# Patient Record
Sex: Female | Born: 1990 | Hispanic: No | Marital: Married | Smoking: Never smoker
Health system: Southern US, Community
[De-identification: ages and names within clinical notes are randomized; demographics above are authoritative.]

## PROBLEM LIST (undated history)

## (undated) ENCOUNTER — Inpatient Hospital Stay (HOSPITAL_COMMUNITY): Payer: Self-pay

## (undated) DIAGNOSIS — Z789 Other specified health status: Secondary | ICD-10-CM

## (undated) HISTORY — PX: NO PAST SURGERIES: SHX2092

---

## 2013-04-14 NOTE — L&D Delivery Note (Signed)
Delivery Note Pt became complete and pushed well and at 11:36 PM a viable female was delivered via Vaginal, Spontaneous Delivery (Presentation: ; Right Occiput Anterior).  APGAR: 9, 9; weight 8 lb 11.2 oz (3945 g).  Infant dried; cord clamped and cut by FOB and infant to warmer per pt request.  Placenta status: Intact, Spontaneous.  Cord: 3 vessels  Anesthesia: Epidural  Episiotomy: None Lacerations: 2nd degree;Perineal Suture Repair: 3.0 vicryl Est. Blood Loss (mL): 500  Mom to postpartum.  Baby to Couplet care / Skin to Skin.  Cam HaiSHAW, KIMBERLY CNM 03/02/2014, 7:11 AM   `````Attestation of Attending Supervision of Advanced Practitioner: Evaluation and management procedures were performed by the PA/NP/CNM/OB Fellow under my supervision/collaboration. Chart reviewed and agree with management and plan.  Marjie Chea V 03/02/2014 8:40 AM

## 2013-07-05 ENCOUNTER — Ambulatory Visit (INDEPENDENT_AMBULATORY_CARE_PROVIDER_SITE_OTHER): Payer: Self-pay

## 2013-07-05 DIAGNOSIS — Z3201 Encounter for pregnancy test, result positive: Secondary | ICD-10-CM

## 2013-07-05 DIAGNOSIS — Z3400 Encounter for supervision of normal first pregnancy, unspecified trimester: Secondary | ICD-10-CM

## 2013-07-05 DIAGNOSIS — Z3689 Encounter for other specified antenatal screening: Secondary | ICD-10-CM

## 2013-07-05 LAB — POCT PREGNANCY, URINE: PREG TEST UR: POSITIVE — AB

## 2013-07-05 NOTE — Progress Notes (Signed)
Pt here for pregnancy test, resulted positive.  Pt stated that she would like to come here for prenatal care.  Pt was sure of LMP. First pregnancy, G1 P0.  Anatomy US scheduled for 10/04/13 @ 0800. Initial labs drawn today.

## 2013-07-06 LAB — OBSTETRIC PANEL
Antibody Screen: NEGATIVE
BASOS ABS: 0 10*3/uL (ref 0.0–0.1)
BASOS PCT: 0 % (ref 0–1)
Eosinophils Absolute: 0.1 10*3/uL (ref 0.0–0.7)
Eosinophils Relative: 1 % (ref 0–5)
HCT: 36.8 % (ref 36.0–46.0)
HEMOGLOBIN: 12 g/dL (ref 12.0–15.0)
HEP B S AG: NEGATIVE
Lymphocytes Relative: 26 % (ref 12–46)
Lymphs Abs: 1.5 10*3/uL (ref 0.7–4.0)
MCH: 25.1 pg — ABNORMAL LOW (ref 26.0–34.0)
MCHC: 32.6 g/dL (ref 30.0–36.0)
MCV: 76.8 fL — ABNORMAL LOW (ref 78.0–100.0)
Monocytes Absolute: 0.5 10*3/uL (ref 0.1–1.0)
Monocytes Relative: 8 % (ref 3–12)
NEUTROS ABS: 3.8 10*3/uL (ref 1.7–7.7)
NEUTROS PCT: 65 % (ref 43–77)
Platelets: 238 10*3/uL (ref 150–400)
RBC: 4.79 MIL/uL (ref 3.87–5.11)
RDW: 13.8 % (ref 11.5–15.5)
Rh Type: POSITIVE
Rubella: 7.45 Index — ABNORMAL HIGH (ref <0.90)
WBC: 5.8 10*3/uL (ref 4.0–10.5)

## 2013-07-06 LAB — PRESCRIPTION MONITORING PROFILE (19 PANEL)
Amphetamine/Meth: NEGATIVE ng/mL
BARBITURATE SCREEN, URINE: NEGATIVE ng/mL
Benzodiazepine Screen, Urine: NEGATIVE ng/mL
Buprenorphine, Urine: NEGATIVE ng/mL
CANNABINOID SCRN UR: NEGATIVE ng/mL
COCAINE METABOLITES: NEGATIVE ng/mL
Carisoprodol, Urine: NEGATIVE ng/mL
Creatinine, Urine: 224.82 mg/dL (ref >20.0)
FENTANYL URINE: NEGATIVE ng/mL
MDMA URINE: NEGATIVE ng/mL
Meperidine, Ur: NEGATIVE ng/mL
Methadone Screen, Urine: NEGATIVE ng/mL
Methaqualone: NEGATIVE ng/mL
Nitrites, Initial: NEGATIVE ug/mL
OPIATE SCREEN, URINE: NEGATIVE ng/mL
Oxycodone Screen, Ur: NEGATIVE ng/mL
PH URINE, INITIAL: 6.8 pH (ref 4.5–8.9)
Phencyclidine, Ur: NEGATIVE ng/mL
Propoxyphene: NEGATIVE ng/mL
TAPENTADOLUR: NEGATIVE ng/mL
Tramadol Scrn, Ur: NEGATIVE ng/mL
Zolpidem, Urine: NEGATIVE ng/mL

## 2013-07-06 LAB — HIV ANTIBODY (ROUTINE TESTING W REFLEX): HIV: NONREACTIVE

## 2013-07-07 LAB — HEMOGLOBINOPATHY EVALUATION
HGB A2 QUANT: 2.5 % (ref 2.2–3.2)
Hemoglobin Other: 0 %
Hgb A: 97.1 % (ref 96.8–97.8)
Hgb F Quant: 0.4 % (ref 0.0–2.0)
Hgb S Quant: 0 %

## 2013-07-07 LAB — CULTURE, OB URINE

## 2013-08-09 ENCOUNTER — Encounter: Payer: Self-pay | Admitting: Advanced Practice Midwife

## 2013-08-09 ENCOUNTER — Ambulatory Visit (INDEPENDENT_AMBULATORY_CARE_PROVIDER_SITE_OTHER): Payer: Self-pay | Admitting: Advanced Practice Midwife

## 2013-08-09 VITALS — BP 116/75 | HR 91 | Ht 63.0 in | Wt 103.5 lb

## 2013-08-09 DIAGNOSIS — Z609 Problem related to social environment, unspecified: Secondary | ICD-10-CM

## 2013-08-09 DIAGNOSIS — K219 Gastro-esophageal reflux disease without esophagitis: Secondary | ICD-10-CM | POA: Insufficient documentation

## 2013-08-09 DIAGNOSIS — Z603 Acculturation difficulty: Secondary | ICD-10-CM | POA: Insufficient documentation

## 2013-08-09 DIAGNOSIS — Z3400 Encounter for supervision of normal first pregnancy, unspecified trimester: Secondary | ICD-10-CM

## 2013-08-09 LAB — POCT URINALYSIS DIP (DEVICE)
Bilirubin Urine: NEGATIVE
GLUCOSE, UA: NEGATIVE mg/dL
HGB URINE DIPSTICK: NEGATIVE
KETONES UR: NEGATIVE mg/dL
Nitrite: NEGATIVE
Protein, ur: NEGATIVE mg/dL
Specific Gravity, Urine: >=1.03 (ref 1.005–1.030)
UROBILINOGEN UA: 0.2 mg/dL (ref 0.0–1.0)
pH: 5.5 (ref 5.0–8.0)

## 2013-08-09 MED ORDER — PRENATAL VITAMINS 0.8 MG PO TABS: 1.0000 | ORAL_TABLET | Freq: Every day | ORAL | Status: DC

## 2013-08-09 MED ORDER — PANTOPRAZOLE SODIUM 20 MG PO TBEC: 20.0000 mg | DELAYED_RELEASE_TABLET | Freq: Every day | ORAL | Status: DC

## 2013-08-09 NOTE — Progress Notes (Signed)
New OB.  Subjective:    Rebekah Morse is a G1P0 8523w3d being seen today for her first obstetrical visit.  Her obstetrical history is significant for slightly late to care, language barrier (Arabic). Patient does intend to breast feed. Pregnancy history fully reviewed.  Patient reports nausea and acid reflux.  Filed Vitals:   08/09/13 1449 08/09/13 1452  BP: 116/75   Pulse: 91   Height:  5\' 3"  (1.6 m)  Weight: 46.947 kg (103 lb 8 oz)     HISTORY: OB History  Gravida Para Term Preterm AB SAB TAB Ectopic Multiple Living  1             # Outcome Date GA Lbr Len/2nd Weight Sex Delivery Anes PTL Lv  1 CUR              History reviewed. No pertinent past medical history. Past Surgical History  Procedure Laterality Date  . No past surgeries     History reviewed. No pertinent family history.   Exam    Uterus:     Pelvic Exam:    Perineum: No Hemorrhoids, Normal Perineum   Vulva: Bartholin's, Urethra, Skene's normal   Vagina:  normal mucosa, normal discharge   pH:    Cervix: no cervical motion tenderness, no lesions and nulliparous appearance   Adnexa: normal adnexa and no mass, fullness, tenderness   Bony Pelvis: gynecoid  System: Breast:  normal appearance, no masses or tenderness   Skin: normal coloration and turgor, no rashes    Neurologic: oriented, normal mood, grossly non-focal   Extremities: normal strength, tone, and muscle mass   HEENT neck supple with midline trachea   Mouth/Teeth mucous membranes moist, pharynx normal without lesions   Neck supple and no masses   Cardiovascular: regular rate and rhythm, no murmurs or gallops   Respiratory:  appears well, vitals normal, no respiratory distress, acyanotic, normal RR, ear and throat exam is normal, neck free of mass or lymphadenopathy, chest clear, no wheezing, crepitations, rhonchi, normal symmetric air entry   Abdomen: soft, non-tender; bowel sounds normal; no masses,  no organomegaly   Urinary: urethral meatus  normal      Assessment:    Pregnancy: G1P0 There are no active problems to display for this patient.       Plan:     Initial labs drawn. Prenatal vitamins. Problem list reviewed and updated. Genetic Screening discussed Quad Screen: requested.  Ultrasound discussed; fetal survey: ordered. For June.  Follow up in 4 weeks. 50% of 30 min visit spent on counseling and coordination of care.     Aviva SignsMarie L Zahari Xiang 08/09/2013

## 2013-08-09 NOTE — Progress Notes (Signed)
C/o of lower abdominal pressure and hip pain.  C/o of discharge with slight odor; denies itching.  Pap with cultures today. Wet prep.

## 2013-08-09 NOTE — Patient Instructions (Signed)
Second Trimester of Pregnancy The second trimester is from week 13 through week 28, months 4 through 6. The second trimester is often a time when you feel your best. Your body has also adjusted to being pregnant, and you begin to feel better physically. Usually, morning sickness has lessened or quit completely, you may have more energy, and you may have an increase in appetite. The second trimester is also a time when the fetus is growing rapidly. At the end of the sixth month, the fetus is about 9 inches long and weighs about 1 pounds. You will likely begin to feel the baby move (quickening) between 18 and 20 weeks of the pregnancy. BODY CHANGES Your body goes through many changes during pregnancy. The changes vary from woman to woman.   Your weight will continue to increase. You will notice your lower abdomen bulging out.  You may begin to get stretch marks on your hips, abdomen, and breasts.  You may develop headaches that can be relieved by medicines approved by your caregiver.  You may urinate more often because the fetus is pressing on your bladder.  You may develop or continue to have heartburn as a result of your pregnancy.  You may develop constipation because certain hormones are causing the muscles that push waste through your intestines to slow down.  You may develop hemorrhoids or swollen, bulging veins (varicose veins).  You may have back pain because of the weight gain and pregnancy hormones relaxing your joints between the bones in your pelvis and as a result of a shift in weight and the muscles that support your balance.  Your breasts will continue to grow and be tender.  Your gums may bleed and may be sensitive to brushing and flossing.  Dark spots or blotches (chloasma, mask of pregnancy) may develop on your face. This will likely fade after the baby is born.  A dark line from your belly button to the pubic area (linea nigra) may appear. This will likely fade after the  baby is born. WHAT TO EXPECT AT YOUR PRENATAL VISITS During a routine prenatal visit:  You will be weighed to make sure you and the fetus are growing normally.  Your blood pressure will be taken.  Your abdomen will be measured to track your baby's growth.  The fetal heartbeat will be listened to.  Any test results from the previous visit will be discussed. Your caregiver may ask you:  How you are feeling.  If you are feeling the baby move.  If you have had any abnormal symptoms, such as leaking fluid, bleeding, severe headaches, or abdominal cramping.  If you have any questions. Other tests that may be performed during your second trimester include:  Blood tests that check for:  Low iron levels (anemia).  Gestational diabetes (between 24 and 28 weeks).  Rh antibodies.  Urine tests to check for infections, diabetes, or protein in the urine.  An ultrasound to confirm the proper growth and development of the baby.  An amniocentesis to check for possible genetic problems.  Fetal screens for spina bifida and Down syndrome. HOME CARE INSTRUCTIONS   Avoid all smoking, herbs, alcohol, and unprescribed drugs. These chemicals affect the formation and growth of the baby.  Follow your caregiver's instructions regarding medicine use. There are medicines that are either safe or unsafe to take during pregnancy.  Exercise only as directed by your caregiver. Experiencing uterine cramps is a good sign to stop exercising.  Continue to eat regular,   healthy meals.  Wear a good support bra for breast tenderness.  Do not use hot tubs, steam rooms, or saunas.  Wear your seat belt at all times when driving.  Avoid raw meat, uncooked cheese, cat litter boxes, and soil used by cats. These carry germs that can cause birth defects in the baby.  Take your prenatal vitamins.  Try taking a stool softener (if your caregiver approves) if you develop constipation. Eat more high-fiber foods,  such as fresh vegetables or fruit and whole grains. Drink plenty of fluids to keep your urine clear or pale yellow.  Take warm sitz baths to soothe any pain or discomfort caused by hemorrhoids. Use hemorrhoid cream if your caregiver approves.  If you develop varicose veins, wear support hose. Elevate your feet for 15 minutes, 3 4 times a day. Limit salt in your diet.  Avoid heavy lifting, wear low heel shoes, and practice good posture.  Rest with your legs elevated if you have leg cramps or low back pain.  Visit your dentist if you have not gone yet during your pregnancy. Use a soft toothbrush to brush your teeth and be gentle when you floss.  A sexual relationship may be continued unless your caregiver directs you otherwise.  Continue to go to all your prenatal visits as directed by your caregiver. SEEK MEDICAL CARE IF:   You have dizziness.  You have mild pelvic cramps, pelvic pressure, or nagging pain in the abdominal area.  You have persistent nausea, vomiting, or diarrhea.  You have a bad smelling vaginal discharge.  You have pain with urination. SEEK IMMEDIATE MEDICAL CARE IF:   You have a fever.  You are leaking fluid from your vagina.  You have spotting or bleeding from your vagina.  You have severe abdominal cramping or pain.  You have rapid weight gain or loss.  You have shortness of breath with chest pain.  You notice sudden or extreme swelling of your face, hands, ankles, feet, or legs.  You have not felt your baby move in over an hour.  You have severe headaches that do not go away with medicine.  You have vision changes. Document Released: 03/25/2001 Document Revised: 12/01/2012 Document Reviewed: 06/01/2012 [employer] Patient Information 2014 [employer].  

## 2013-08-10 LAB — WET PREP, GENITAL
Clue Cells Wet Prep HPF POC: NONE SEEN
Trich, Wet Prep: NONE SEEN
WBC, Wet Prep HPF POC: NONE SEEN

## 2013-08-16 ENCOUNTER — Telehealth: Payer: Self-pay | Admitting: *Deleted

## 2013-08-16 ENCOUNTER — Encounter: Payer: Self-pay | Admitting: *Deleted

## 2013-08-16 DIAGNOSIS — B3731 Acute candidiasis of vulva and vagina: Secondary | ICD-10-CM

## 2013-08-16 DIAGNOSIS — B373 Candidiasis of vulva and vagina: Secondary | ICD-10-CM

## 2013-08-16 MED ORDER — FLUCONAZOLE 150 MG PO TABS: 150.0000 mg | ORAL_TABLET | Freq: Once | ORAL | Status: DC

## 2013-08-16 NOTE — Telephone Encounter (Signed)
Patient has yeast on her pap and wet prep. Called her to let her know but phone doesn't ring, just picks up and then disconnects. Will send rx to pharmacy and send letter to patient.

## 2013-09-06 ENCOUNTER — Ambulatory Visit (INDEPENDENT_AMBULATORY_CARE_PROVIDER_SITE_OTHER): Payer: PPO | Admitting: Family Medicine

## 2013-09-06 VITALS — BP 108/71 | HR 85 | Temp 98.5°F | Wt 107.9 lb

## 2013-09-06 DIAGNOSIS — Z609 Problem related to social environment, unspecified: Secondary | ICD-10-CM

## 2013-09-06 DIAGNOSIS — Z603 Acculturation difficulty: Secondary | ICD-10-CM

## 2013-09-06 DIAGNOSIS — Z3400 Encounter for supervision of normal first pregnancy, unspecified trimester: Secondary | ICD-10-CM

## 2013-09-06 NOTE — Progress Notes (Signed)
S: 23 yo G1 @ [redacted]w[redacted]d here for ROBV - doing well.  - no concerns. No bleeding.  - declines quad screen today   O: see flowsheet  A/P - doing well - declines quad screen - anatomy US scheduled.  - f/u in 4 weeks

## 2013-09-06 NOTE — Patient Instructions (Signed)
Second Trimester of Pregnancy The second trimester is from week 13 through week 28, months 4 through 6. The second trimester is often a time when you feel your best. Your body has also adjusted to being pregnant, and you begin to feel better physically. Usually, morning sickness has lessened or quit completely, you may have more energy, and you may have an increase in appetite. The second trimester is also a time when the fetus is growing rapidly. At the end of the sixth month, the fetus is about 9 inches long and weighs about 1 pounds. You will likely begin to feel the baby move (quickening) between 18 and 20 weeks of the pregnancy. BODY CHANGES Your body goes through many changes during pregnancy. The changes vary from woman to woman.   Your weight will continue to increase. You will notice your lower abdomen bulging out.  You may begin to get stretch marks on your hips, abdomen, and breasts.  You may develop headaches that can be relieved by medicines approved by your caregiver.  You may urinate more often because the fetus is pressing on your bladder.  You may develop or continue to have heartburn as a result of your pregnancy.  You may develop constipation because certain hormones are causing the muscles that push waste through your intestines to slow down.  You may develop hemorrhoids or swollen, bulging veins (varicose veins).  You may have back pain because of the weight gain and pregnancy hormones relaxing your joints between the bones in your pelvis and as a result of a shift in weight and the muscles that support your balance.  Your breasts will continue to grow and be tender.  Your gums may bleed and may be sensitive to brushing and flossing.  Dark spots or blotches (chloasma, mask of pregnancy) may develop on your face. This will likely fade after the baby is born.  A dark line from your belly button to the pubic area (linea nigra) may appear. This will likely fade after the  baby is born. WHAT TO EXPECT AT YOUR PRENATAL VISITS During a routine prenatal visit:  You will be weighed to make sure you and the fetus are growing normally.  Your blood pressure will be taken.  Your abdomen will be measured to track your baby's growth.  The fetal heartbeat will be listened to.  Any test results from the previous visit will be discussed. Your caregiver may ask you:  How you are feeling.  If you are feeling the baby move.  If you have had any abnormal symptoms, such as leaking fluid, bleeding, severe headaches, or abdominal cramping.  If you have any questions. Other tests that may be performed during your second trimester include:  Blood tests that check for:  Low iron levels (anemia).  Gestational diabetes (between 24 and 28 weeks).  Rh antibodies.  Urine tests to check for infections, diabetes, or protein in the urine.  An ultrasound to confirm the proper growth and development of the baby.  An amniocentesis to check for possible genetic problems.  Fetal screens for spina bifida and Down syndrome. HOME CARE INSTRUCTIONS   Avoid all smoking, herbs, alcohol, and unprescribed drugs. These chemicals affect the formation and growth of the baby.  Follow your caregiver's instructions regarding medicine use. There are medicines that are either safe or unsafe to take during pregnancy.  Exercise only as directed by your caregiver. Experiencing uterine cramps is a good sign to stop exercising.  Continue to eat regular,   healthy meals.  Wear a good support bra for breast tenderness.  Do not use hot tubs, steam rooms, or saunas.  Wear your seat belt at all times when driving.  Avoid raw meat, uncooked cheese, cat litter boxes, and soil used by cats. These carry germs that can cause birth defects in the baby.  Take your prenatal vitamins.  Try taking a stool softener (if your caregiver approves) if you develop constipation. Eat more high-fiber foods,  such as fresh vegetables or fruit and whole grains. Drink plenty of fluids to keep your urine clear or pale yellow.  Take warm sitz baths to soothe any pain or discomfort caused by hemorrhoids. Use hemorrhoid cream if your caregiver approves.  If you develop varicose veins, wear support hose. Elevate your feet for 15 minutes, 3 4 times a day. Limit salt in your diet.  Avoid heavy lifting, wear low heel shoes, and practice good posture.  Rest with your legs elevated if you have leg cramps or low back pain.  Visit your dentist if you have not gone yet during your pregnancy. Use a soft toothbrush to brush your teeth and be gentle when you floss.  A sexual relationship may be continued unless your caregiver directs you otherwise.  Continue to go to all your prenatal visits as directed by your caregiver. SEEK MEDICAL CARE IF:   You have dizziness.  You have mild pelvic cramps, pelvic pressure, or nagging pain in the abdominal area.  You have persistent nausea, vomiting, or diarrhea.  You have a bad smelling vaginal discharge.  You have pain with urination. SEEK IMMEDIATE MEDICAL CARE IF:   You have a fever.  You are leaking fluid from your vagina.  You have spotting or bleeding from your vagina.  You have severe abdominal cramping or pain.  You have rapid weight gain or loss.  You have shortness of breath with chest pain.  You notice sudden or extreme swelling of your face, hands, ankles, feet, or legs.  You have not felt your baby move in over an hour.  You have severe headaches that do not go away with medicine.  You have vision changes. Document Released: 03/25/2001 Document Revised: 12/01/2012 Document Reviewed: 06/01/2012 [employer] Patient Information 2014 [employer].  

## 2013-09-07 LAB — POCT URINALYSIS DIP (DEVICE)
BILIRUBIN URINE: NEGATIVE
GLUCOSE, UA: NEGATIVE mg/dL
HGB URINE DIPSTICK: NEGATIVE
Ketones, ur: NEGATIVE mg/dL
Leukocytes, UA: NEGATIVE
Nitrite: NEGATIVE
Protein, ur: NEGATIVE mg/dL
SPECIFIC GRAVITY, URINE: 1.015 (ref 1.005–1.030)
Urobilinogen, UA: 0.2 mg/dL (ref 0.0–1.0)
pH: 6 (ref 5.0–8.0)

## 2013-09-19 ENCOUNTER — Ambulatory Visit (HOSPITAL_COMMUNITY)
Admission: RE | Admit: 2013-09-19 | Discharge: 2013-09-19 | Disposition: A | Discharge status: Home / Self Care | Payer: PPO | Source: Ambulatory Visit | Attending: Obstetrics & Gynecology | Admitting: Obstetrics & Gynecology

## 2013-09-19 DIAGNOSIS — Z3400 Encounter for supervision of normal first pregnancy, unspecified trimester: Secondary | ICD-10-CM

## 2013-09-19 DIAGNOSIS — Z3689 Encounter for other specified antenatal screening: Secondary | ICD-10-CM | POA: Diagnosis not present

## 2013-09-26 ENCOUNTER — Encounter: Payer: Self-pay | Admitting: Obstetrics & Gynecology

## 2013-09-27 ENCOUNTER — Telehealth: Payer: Self-pay | Admitting: General Practice

## 2013-09-27 ENCOUNTER — Encounter: Payer: Self-pay | Admitting: General Practice

## 2013-09-27 DIAGNOSIS — Z3400 Encounter for supervision of normal first pregnancy, unspecified trimester: Secondary | ICD-10-CM

## 2013-09-27 NOTE — Telephone Encounter (Signed)
Ultrasound appt made for 7/7 @ 10:45. Called patient with pacific interpreter [phone] on home number, no answer & unable to leave a message. Called patient at mobile number, no answer- left message that we calling to get in touch with you about an appt, please give us a call back at the clinics.

## 2013-09-27 NOTE — Telephone Encounter (Signed)
Message copied by Kathee DeltonHILLMAN, CARRIE L on Tue Sep 27, 2013  8:01 AM ------      Message from: Willodean RosenthalHARRAWAY-SMITH, CAROLYN      Created: Mon Sep 26, 2013  5:59 PM       Please call pt.  She needs to be schedule for complete anatomy scan in the next 2-3 weeks.            thx      clh-S ------

## 2013-09-28 NOTE — Telephone Encounter (Signed)
Called patient with pacific interpreter [phone], no answer on both numbers and unable to leave message.

## 2013-09-29 NOTE — Telephone Encounter (Signed)
Note made to inform patient of ultrasound at her visit next week.

## 2013-10-04 ENCOUNTER — Ambulatory Visit (INDEPENDENT_AMBULATORY_CARE_PROVIDER_SITE_OTHER): Payer: PPO | Admitting: Advanced Practice Midwife

## 2013-10-04 VITALS — BP 108/64 | HR 91 | Temp 98.3°F | Wt 111.6 lb

## 2013-10-04 DIAGNOSIS — Z3400 Encounter for supervision of normal first pregnancy, unspecified trimester: Secondary | ICD-10-CM

## 2013-10-04 DIAGNOSIS — Z3402 Encounter for supervision of normal first pregnancy, second trimester: Secondary | ICD-10-CM

## 2013-10-04 LAB — POCT URINALYSIS DIP (DEVICE)
BILIRUBIN URINE: NEGATIVE
GLUCOSE, UA: NEGATIVE mg/dL
Hgb urine dipstick: NEGATIVE
Ketones, ur: NEGATIVE mg/dL
LEUKOCYTES UA: NEGATIVE
Nitrite: NEGATIVE
Protein, ur: NEGATIVE mg/dL
Specific Gravity, Urine: 1.025 (ref 1.005–1.030)
Urobilinogen, UA: 0.2 mg/dL (ref 0.0–1.0)
pH: 6.5 (ref 5.0–8.0)

## 2013-10-04 NOTE — Progress Notes (Signed)
Doing well.  Feeling fetal flutters, denies vaginal bleeding, LOF, regular contractions.  Some pelvic girdle pain/pain in hips with movement.  Reviewed comfortable positions for sleep, normal physiologic changes of pregnancy.

## 2013-10-04 NOTE — Progress Notes (Signed)
Reports bilateral hip pain and intermittent lower abdominal/pelvic pain.  Informed patient of U/S on 7/7

## 2013-10-07 ENCOUNTER — Ambulatory Visit (HOSPITAL_COMMUNITY): Payer: Self-pay

## 2013-10-10 ENCOUNTER — Encounter: Payer: Self-pay | Admitting: General Practice

## 2013-10-18 ENCOUNTER — Ambulatory Visit (HOSPITAL_COMMUNITY)
Admission: RE | Admit: 2013-10-18 | Discharge: 2013-10-18 | Disposition: A | Discharge status: Home / Self Care | Payer: PPO | Source: Ambulatory Visit | Attending: Obstetrics & Gynecology | Admitting: Obstetrics & Gynecology

## 2013-10-18 DIAGNOSIS — Z3689 Encounter for other specified antenatal screening: Secondary | ICD-10-CM | POA: Insufficient documentation

## 2013-10-18 DIAGNOSIS — Z3400 Encounter for supervision of normal first pregnancy, unspecified trimester: Secondary | ICD-10-CM

## 2013-10-18 DIAGNOSIS — Z3402 Encounter for supervision of normal first pregnancy, second trimester: Secondary | ICD-10-CM

## 2013-10-27 ENCOUNTER — Encounter: Payer: Self-pay | Admitting: Obstetrics & Gynecology

## 2013-10-29 ENCOUNTER — Emergency Department (HOSPITAL_COMMUNITY)
Admission: EM | Admit: 2013-10-29 | Discharge: 2013-10-29 | Disposition: A | Discharge status: Home / Self Care | Payer: PPO | Source: Home / Self Care | Attending: Emergency Medicine | Admitting: Emergency Medicine

## 2013-10-29 ENCOUNTER — Encounter (HOSPITAL_COMMUNITY): Payer: Self-pay | Admitting: Emergency Medicine

## 2013-10-29 ENCOUNTER — Inpatient Hospital Stay (HOSPITAL_COMMUNITY)
Admission: AD | Admit: 2013-10-29 | Discharge: 2013-10-29 | Disposition: A | Discharge status: Home / Self Care | Payer: PPO | Source: Ambulatory Visit | Attending: Obstetrics & Gynecology | Admitting: Obstetrics & Gynecology

## 2013-10-29 ENCOUNTER — Encounter (HOSPITAL_COMMUNITY): Payer: Self-pay

## 2013-10-29 DIAGNOSIS — O4702 False labor before 37 completed weeks of gestation, second trimester: Secondary | ICD-10-CM

## 2013-10-29 DIAGNOSIS — O212 Late vomiting of pregnancy: Secondary | ICD-10-CM | POA: Insufficient documentation

## 2013-10-29 DIAGNOSIS — O9989 Other specified diseases and conditions complicating pregnancy, childbirth and the puerperium: Principal | ICD-10-CM

## 2013-10-29 DIAGNOSIS — Z79899 Other long term (current) drug therapy: Secondary | ICD-10-CM

## 2013-10-29 DIAGNOSIS — R141 Gas pain: Secondary | ICD-10-CM | POA: Insufficient documentation

## 2013-10-29 DIAGNOSIS — R142 Eructation: Secondary | ICD-10-CM

## 2013-10-29 DIAGNOSIS — O99891 Other specified diseases and conditions complicating pregnancy: Secondary | ICD-10-CM | POA: Insufficient documentation

## 2013-10-29 DIAGNOSIS — O4700 False labor before 37 completed weeks of gestation, unspecified trimester: Secondary | ICD-10-CM

## 2013-10-29 DIAGNOSIS — O26899 Other specified pregnancy related conditions, unspecified trimester: Secondary | ICD-10-CM

## 2013-10-29 DIAGNOSIS — R143 Flatulence: Secondary | ICD-10-CM | POA: Insufficient documentation

## 2013-10-29 DIAGNOSIS — R1110 Vomiting, unspecified: Secondary | ICD-10-CM

## 2013-10-29 DIAGNOSIS — R109 Unspecified abdominal pain: Secondary | ICD-10-CM | POA: Diagnosis present

## 2013-10-29 LAB — URINALYSIS, ROUTINE W REFLEX MICROSCOPIC
Bilirubin Urine: NEGATIVE
Glucose, UA: NEGATIVE mg/dL
Hgb urine dipstick: NEGATIVE
Ketones, ur: NEGATIVE mg/dL
LEUKOCYTES UA: NEGATIVE
NITRITE: NEGATIVE
Protein, ur: NEGATIVE mg/dL
UROBILINOGEN UA: 0.2 mg/dL (ref 0.0–1.0)
pH: 6 (ref 5.0–8.0)

## 2013-10-29 MED ORDER — PROMETHAZINE HCL 25 MG PO TABS: 25.0000 mg | ORAL_TABLET | Freq: Four times a day (QID) | ORAL | Status: AC | PRN

## 2013-10-29 NOTE — MAU Provider Note (Signed)
Attestation of Attending Supervision of Advanced Practitioner (CNM/NP): Evaluation and management procedures were performed by the Advanced Practitioner under my supervision and collaboration.  I have reviewed the Advanced Practitioner's note and chart, and I agree with the management and plan.  HARRAWAY-SMITH, Aleatha Taite 11:16 AM

## 2013-10-29 NOTE — MAU Provider Note (Signed)
History     CSN: [id number]  Arrival date and time: 10/29/13 [id number]   First Provider Initiated Contact with Patient 10/29/13 [phone]      Chief Complaint  Patient presents with  . Abdominal Cramping   HPI Comments: Rebekah Morse 23 y.o. G1P0000 6968w4d presents to MAU with abdominal cramping that started this morning. She had some nausea and vomited at 5 am. She thought her nausea had passed so [facility] became [facility] She was transferred her for same issue. She was check there an found to have closed cervix. +FHT. [facility] and has an appointment on Wednesday.  Abdominal Cramping Associated symptoms include nausea and vomiting.      History reviewed. No pertinent past medical history.  Past Surgical History  Procedure Laterality Date  . No past surgeries      History reviewed. No pertinent family history.  History  Substance Use Topics  . Smoking status: Never Smoker   . Smokeless tobacco: Never Used  . Alcohol Use: No    Allergies: No Known Allergies  Prescriptions prior to admission  Medication Sig Dispense Refill  . Prenatal Vit-Fe Fumarate-FA (PRENATAL MULTIVITAMIN) TABS tablet Take 1 tablet by mouth daily at 12 noon.        Review of Systems  Constitutional: Negative.   HENT: Negative.   Eyes: Negative.   Respiratory: Negative.   Cardiovascular: Negative.   Gastrointestinal: Positive for heartburn, nausea, vomiting and abdominal pain.  Genitourinary: Negative.   Musculoskeletal: Negative.   Skin: Negative.   Neurological: Negative.   Psychiatric/Behavioral: Negative.    Physical Exam   Blood pressure 101/53, pulse 69, temperature 98 F (36.7 C), temperature source Oral, resp. rate 16, last menstrual period 04/30/2013.  Physical Exam  Constitutional: She is oriented to person, place, and time. She appears well-developed and well-nourished. No distress.  HENT:  Head: Normocephalic and atraumatic.  GI: Soft. She  exhibits no distension. There is no tenderness. There is no rebound and no guarding.  Genitourinary:  Genital:External Vaginal:Small amount white discharge Cervix:closed/ thick Bimanual:nontender/ gravid   Musculoskeletal: Normal range of motion.  Neurological: She is alert and oriented to person, place, and time.  Skin: Skin is warm and dry.  Psychiatric: She has a normal mood and affect. Her behavior is normal. Judgment and thought content normal.   Results for orders placed during the hospital encounter of 10/29/13 (from the past 24 hour(s))  URINALYSIS, ROUTINE W REFLEX MICROSCOPIC     Status: Abnormal   Collection Time    10/29/13  8:20 AM      Result Value Ref Range   Color, Urine YELLOW  YELLOW   APPearance CLEAR  CLEAR   Specific Gravity, Urine <1.005 (*) 1.005 - 1.030   pH 6.0  5.0 - 8.0   Glucose, UA NEGATIVE  NEGATIVE mg/dL   Hgb urine dipstick NEGATIVE  NEGATIVE   Bilirubin Urine NEGATIVE  NEGATIVE   Ketones, ur NEGATIVE  NEGATIVE mg/dL   Protein, ur NEGATIVE  NEGATIVE mg/dL   Urobilinogen, UA 0.2  0.0 - 1.0 mg/dL   Nitrite NEGATIVE  NEGATIVE   Leukocytes, UA NEGATIVE  NEGATIVE    MAU Course  Procedures  MDM   Assessment and Plan  A: Abdominal pains in pregnancy likely related to nausea  P: Phenergan 25 mg po q8 hrs prn  Fluids/ rest/ tylenol Keep OB appointment Wednesday Return to MAU as needed  Carolynn ServeBarefoot, Sanaiyah Kirchhoff Miller 10/29/2013, 9:49 AM

## 2013-10-29 NOTE — ED Notes (Signed)
Husband/translator stated,she started having abd.pain about a hour ago. No bleeding just abd. Pain.  Pt. Is 6 months pregnant

## 2013-10-29 NOTE — Progress Notes (Signed)
Dr. Freida BusmanAllen spoke with Dr. Emelda FearFerguson. Pt and her husband [facility] for further monitoring.

## 2013-10-29 NOTE — ED Notes (Signed)
Ob rapid response rn here

## 2013-10-29 NOTE — ED Provider Notes (Addendum)
CSN: [id number]     Arrival date & time 10/29/13  [id number] History   First MD Initiated Contact with Patient 10/29/13 0703     Chief Complaint  Patient presents with  . Abdominal Pain  . Emesis  . Routine Prenatal Visit     (Consider location/radiation/quality/duration/timing/severity/associated sxs/prior Treatment) Patient is a 23 y.o. female presenting with abdominal pain and vomiting. The history is provided by the patient.  Abdominal Pain Associated symptoms: vomiting   Emesis Associated symptoms: abdominal pain    patient here complaining of mild abdominal cramping since this morning. Patient is 6 months pregnant and has had uncomplicated pregnancy. She denies any urinary symptoms. No vaginal bleeding or discharge. The abdominal cramping and pain only lasts for a few minutes. Symptoms are spontaneous and nothing makes it better or worse. No treatment used prior to arrival.  History reviewed. No pertinent past medical history. Past Surgical History  Procedure Laterality Date  . No past surgeries     No family history on file. History  Substance Use Topics  . Smoking status: Never Smoker   . Smokeless tobacco: Never Used  . Alcohol Use: No   OB History   Grav Para Term Preterm Abortions TAB SAB Ect Mult Living   1 0 0 0 0 0 0 0 0 0      Review of Systems  Gastrointestinal: Positive for vomiting and abdominal pain.  All other systems reviewed and are negative.     Allergies  Review of patient's allergies indicates no known allergies.  Home Medications   Prior to Admission medications   Medication Sig Start Date End Date Taking? Authorizing Provider  Prenatal Vit-Fe Fumarate-FA (PRENATAL MULTIVITAMIN) TABS tablet Take 1 tablet by mouth daily at 12 noon.   Yes Historical Provider, MD   BP 98/60  Pulse 66  Temp(Src) 98.2 F (36.8 C) (Oral)  Resp 18  Wt 116 lb 13.5 oz (53 kg)  SpO2 98%  LMP 04/30/2013 Physical Exam  Nursing note and vitals  reviewed. Constitutional: She is oriented to person, place, and time. She appears well-developed and well-nourished.  Non-toxic appearance. No distress.  HENT:  Head: Normocephalic and atraumatic.  Eyes: Conjunctivae, EOM and lids are normal. Pupils are equal, round, and reactive to light.  Neck: Normal range of motion. Neck supple. No tracheal deviation present. No mass present.  Cardiovascular: Normal rate, regular rhythm and normal heart sounds.  Exam reveals no gallop.   No murmur heard. Pulmonary/Chest: Effort normal and breath sounds normal. No stridor. No respiratory distress. She has no decreased breath sounds. She has no wheezes. She has no rhonchi. She has no rales.  Abdominal: Soft. She exhibits distension. There is no tenderness. There is no rebound and no CVA tenderness.  gravid  Musculoskeletal: Normal range of motion. She exhibits no edema and no tenderness.  Neurological: She is alert and oriented to person, place, and time. She has normal strength. No cranial nerve deficit or sensory deficit. GCS eye subscore is 4. GCS verbal subscore is 5. GCS motor subscore is 6.  Skin: Skin is warm and dry. No abrasion and no rash noted.  Psychiatric: She has a normal mood and affect. Her speech is normal and behavior is normal.    ED Course  Procedures (including critical care time) Labs Review Labs Reviewed - No data to display  Imaging Review No results found.   EKG Interpretation None      MDM   Final diagnoses:  None  Has been seen by the rapid response nurse who did that patient's GYN exam because of the patient's religious believes. Patient has no evidence of active labor at this time. Fetal heart tones 150.Per the OB nurse, patient's cervix closed there is no evidence of vaginal bleeding. I went back to examine the patient's abdomen prior to discharge and the patient has eloped.Case discussed with the OB on call, dr Emelda Fear, prior to the patient's leaving and it was  agreed that the patient is stable to be transported by private vehicle at this time. The patient and her husband are agreeable to this.  The patient did not receive her discharge instructions because they left before they were given to them  Toy Baker, MD 10/29/13 0725  Toy Baker, MD 10/29/13 0730  Toy Baker, MD 10/29/13 [id number]  Toy Baker, MD 10/29/13 [phone]

## 2013-10-29 NOTE — ED Notes (Signed)
Pt hooked to the fetal monitor by the ob rn.  No vaginal bleeding..  The pt reports that her abd pain is decreased.

## 2013-10-29 NOTE — MAU Note (Signed)
[facility]. Cervix was closed and long.

## 2013-10-29 NOTE — Progress Notes (Signed)
Dr. Freida BusmanAllen in to see pt. Pt had been c/o some cramping upon arrival to ED. No vag bleeding, no leaking of fluid. SVE done by Alcide GoodnessKathy Shaniqua Guillot RN earlier, cervix closed. Uterine irritability on EFM. FHR monitored previously by Alcide GoodnessKathy Shaivi Rothschild RN, FHR 150 BPM. [facility].

## 2013-10-29 NOTE — Discharge Instructions (Signed)
[facility]

## 2013-10-29 NOTE — Discharge Instructions (Signed)
Abdominal Pain During Pregnancy Abdominal pain is common in pregnancy. Most of the time, it does not cause harm. There are many causes of abdominal pain. Some causes are more serious than others. Some of the causes of abdominal pain in pregnancy are easily diagnosed. Occasionally, the diagnosis takes time to understand. Other times, the cause is not determined. Abdominal pain can be a sign that something is very wrong with the pregnancy, or the pain may have nothing to do with the pregnancy at all. For this reason, always tell your health care provider if you have any abdominal discomfort. HOME CARE INSTRUCTIONS  Monitor your abdominal pain for any changes. The following actions may help to alleviate any discomfort you are experiencing:  Do not have sexual intercourse or put anything in your vagina until your symptoms go away completely.  Get plenty of rest until your pain improves.  Drink clear fluids if you feel nauseous. Avoid solid food as long as you are uncomfortable or nauseous.  Only take over-the-counter or prescription medicine as directed by your health care provider.  Keep all follow-up appointments with your health care provider. SEEK IMMEDIATE MEDICAL CARE IF:  You are bleeding, leaking fluid, or passing tissue from the vagina.  You have increasing pain or cramping.  You have persistent vomiting.  You have painful or bloody urination.  You have a fever.  You notice a decrease in your baby's movements.  You have extreme weakness or feel faint.  You have shortness of breath, with or without abdominal pain.  You develop a severe headache with abdominal pain.  You have abnormal vaginal discharge with abdominal pain.  You have persistent diarrhea.  You have abdominal pain that continues even after rest, or gets worse. MAKE SURE YOU:   Understand these instructions.  Will watch your condition.  Will get help right away if you are not doing well or get  worse. Document Released: 03/31/2005 Document Revised: 01/19/2013 Document Reviewed: 10/28/2012 [facility] Patient Information 2015 [place], [place]. This information is not intended to replace advice given to you by your health care provider. Make sure you discuss any questions you have with your health care provider. Morning Sickness Morning sickness is when you feel sick to your stomach (nauseous) during pregnancy. This nauseous feeling may or may not come with vomiting. It often occurs in the morning but can be a problem any time of day. Morning sickness is most common during the first trimester, but it may continue throughout pregnancy. While morning sickness is unpleasant, it is usually harmless unless you develop severe and continual vomiting (hyperemesis gravidarum). This condition requires more intense treatment.  CAUSES  The cause of morning sickness is not completely known but seems to be related to normal hormonal changes that occur in pregnancy. RISK FACTORS You are at greater risk if you:  Experienced nausea or vomiting before your pregnancy.  Had morning sickness during a previous pregnancy.  Are pregnant with more than one baby, such as twins. TREATMENT  Do not use any medicines (prescription, over-the-counter, or herbal) for morning sickness without first talking to your health care provider. Your health care provider may prescribe or recommend:  Vitamin B6 supplements.  Anti-nausea medicines.  The herbal medicine ginger. HOME CARE INSTRUCTIONS   Only take over-the-counter or prescription medicines as directed by your health care provider.  Taking multivitamins before getting pregnant can prevent or decrease the severity of morning sickness in most women.  Eat a piece of dry toast or unsalted crackers  before getting out of bed in the morning.  Eat five or six small meals a day.  Eat dry and bland foods (rice, baked potato). Foods high in carbohydrates are often  helpful.  Do not drink liquids with your meals. Drink liquids between meals.  Avoid greasy, fatty, and spicy foods.  Get someone to cook for you if the smell of any food causes nausea and vomiting.  If you feel nauseous after taking prenatal vitamins, take the vitamins at night or with a snack.  Snack on protein foods (nuts, yogurt, cheese) between meals if you are hungry.  Eat unsweetened gelatins for desserts.  Wearing an acupressure wristband (worn for sea sickness) may be helpful.  Acupuncture may be helpful.  Do not smoke.  Get a humidifier to keep the air in your house free of odors.  Get plenty of fresh air. SEEK MEDICAL CARE IF:   Your home remedies are not working, and you need medicine.  You feel dizzy or lightheaded.  You are losing weight. SEEK IMMEDIATE MEDICAL CARE IF:   You have persistent and uncontrolled nausea and vomiting.  You pass out (faint). MAKE SURE YOU:  Understand these instructions.  Will watch your condition.  Will get help right away if you are not doing well or get worse. Document Released: 05/22/2006 Document Revised: 04/05/2013 Document Reviewed: 09/15/2012 [facility] Patient Information 2015 [place], [place]. This information is not intended to replace advice given to you by your health care provider. Make sure you discuss any questions you have with your health care provider.

## 2013-10-29 NOTE — Progress Notes (Addendum)
Pt is a G1P0, EDC 02/28/14, gest age [id number]. Pt speaks arabic w/husband translating--States sharp abd pain started about 0430, (3/10). [facility]

## 2013-10-29 NOTE — ED Notes (Signed)
Paged [facility], responded and on the way.

## 2013-10-29 NOTE — ED Notes (Signed)
Pt left prior to receiving d/c paperwork. [facility]

## 2013-10-29 NOTE — Progress Notes (Signed)
Spoke with Dr. Emelda FearFerguson. Pt is 224/[redacted] weeks pregnant with c/o lower abd pain. FHR obtained earlier by Alcide GoodnessKathy Isley Zinni RN 150 BPM. UI on monitor, some uc's. No vag bleeding, no leaking of fluid. Cervix is closed per Alcide GoodnessKathy Lazarius Rivkin RN. Okay for pt and [facility] Pt recieves her care there.

## 2013-11-02 ENCOUNTER — Ambulatory Visit (INDEPENDENT_AMBULATORY_CARE_PROVIDER_SITE_OTHER): Payer: PPO | Admitting: Advanced Practice Midwife

## 2013-11-02 VITALS — BP 108/59 | HR 77 | Temp 98.2°F | Wt 121.9 lb

## 2013-11-02 DIAGNOSIS — Z3400 Encounter for supervision of normal first pregnancy, unspecified trimester: Secondary | ICD-10-CM

## 2013-11-02 DIAGNOSIS — Z3402 Encounter for supervision of normal first pregnancy, second trimester: Secondary | ICD-10-CM

## 2013-11-02 LAB — POCT URINALYSIS DIP (DEVICE)
BILIRUBIN URINE: NEGATIVE
GLUCOSE, UA: NEGATIVE mg/dL
Hgb urine dipstick: NEGATIVE
Ketones, ur: NEGATIVE mg/dL
LEUKOCYTES UA: NEGATIVE
NITRITE: NEGATIVE
Protein, ur: NEGATIVE mg/dL
Specific Gravity, Urine: 1.015 (ref 1.005–1.030)
Urobilinogen, UA: 0.2 mg/dL (ref 0.0–1.0)
pH: 7 (ref 5.0–8.0)

## 2013-11-02 NOTE — Patient Instructions (Addendum)
Contraception Choices Contraception (birth control) is the use of any methods or devices to prevent pregnancy. Below are some methods to help avoid pregnancy. HORMONAL METHODS   Contraceptive implant. This is a thin, plastic tube containing progesterone hormone. It does not contain estrogen hormone. Your health care provider inserts the tube in the inner part of the upper arm. The tube can remain in place for up to 3 years. After 3 years, the implant must be removed. The implant prevents the ovaries from releasing an egg (ovulation), thickens the cervical mucus to prevent sperm from entering the uterus, and thins the lining of the inside of the uterus.  Progesterone-only injections. These injections are given every 3 months by your health care provider to prevent pregnancy. This synthetic progesterone hormone stops the ovaries from releasing eggs. It also thickens cervical mucus and changes the uterine lining. This makes it harder for sperm to survive in the uterus.  Birth control pills. These pills contain estrogen and progesterone hormone. They work by preventing the ovaries from releasing eggs (ovulation). They also cause the cervical mucus to thicken, preventing the sperm from entering the uterus. Birth control pills are prescribed by a health care provider.Birth control pills can also be used to treat heavy periods.  Minipill. This type of birth control pill contains only the progesterone hormone. They are taken every day of each month and must be prescribed by your health care provider.  Birth control patch. The patch contains hormones similar to those in birth control pills. It must be changed once a week and is prescribed by a health care provider.  Vaginal ring. The ring contains hormones similar to those in birth control pills. It is left in the vagina for 3 weeks, removed for 1 week, and then a new one is put back in place. The patient must be comfortable inserting and removing the ring  from the vagina.A health care provider's prescription is necessary.  Emergency contraception. Emergency contraceptives prevent pregnancy after unprotected sexual intercourse. This pill can be taken right after sex or up to 5 days after unprotected sex. It is most effective the sooner you take the pills after having sexual intercourse. Most emergency contraceptive pills are available without a prescription. Check with your pharmacist. Do not use emergency contraception as your only form of birth control. BARRIER METHODS   Female condom. This is a thin sheath (latex or rubber) that is worn over the penis during sexual intercourse. It can be used with spermicide to increase effectiveness.  Female condom. This is a soft, loose-fitting sheath that is put into the vagina before sexual intercourse.  Diaphragm. This is a soft, latex, dome-shaped barrier that must be fitted by a health care provider. It is inserted into the vagina, along with a spermicidal jelly. It is inserted before intercourse. The diaphragm should be left in the vagina for 6 to 8 hours after intercourse.  Cervical cap. This is a round, soft, latex or plastic cup that fits over the cervix and must be fitted by a health care provider. The cap can be left in place for up to 48 hours after intercourse.  Sponge. This is a soft, circular piece of polyurethane foam. The sponge has spermicide in it. It is inserted into the vagina after wetting it and before sexual intercourse.  Spermicides. These are chemicals that kill or block sperm from entering the cervix and uterus. They come in the form of creams, jellies, suppositories, foam, or tablets. They do not require a   prescription. They are inserted into the vagina with an applicator before having sexual intercourse. The process must be repeated every time you have sexual intercourse. INTRAUTERINE CONTRACEPTION  Intrauterine device (IUD). This is a T-shaped device that is put in a woman's uterus  during a menstrual period to prevent pregnancy. There are 2 types:  Copper IUD. This type of IUD is wrapped in copper wire and is placed inside the uterus. Copper makes the uterus and fallopian tubes produce a fluid that kills sperm. It can stay in place for 10 years.  (MIRENA) Hormone IUD. This type of IUD contains the hormone progestin (synthetic progesterone). The hormone thickens the cervical mucus and prevents sperm from entering the uterus, and it also thins the uterine lining to prevent implantation of a fertilized egg. The hormone can weaken or kill the sperm that get into the uterus. It can stay in place for 3-5 years, depending on which type of IUD is used. PERMANENT METHODS OF CONTRACEPTION  Female tubal ligation. This is when the woman's fallopian tubes are surgically sealed, tied, or blocked to prevent the egg from traveling to the uterus.  Hysteroscopic sterilization. This involves placing a small coil or insert into each fallopian tube. Your doctor uses a technique called hysteroscopy to do the procedure. The device causes scar tissue to form. This results in permanent blockage of the fallopian tubes, so the sperm cannot fertilize the egg. It takes about 3 months after the procedure for the tubes to become blocked. You must use another form of birth control for these 3 months.  Female sterilization. This is when the female has the tubes that carry sperm tied off (vasectomy).This blocks sperm from entering the vagina during sexual intercourse. After the procedure, the man can still ejaculate fluid (semen). NATURAL PLANNING METHODS  Natural family planning. This is not having sexual intercourse or using a barrier method (condom, diaphragm, cervical cap) on days the woman could become pregnant.  Calendar method. This is keeping track of the length of each menstrual cycle and identifying when you are fertile.  Ovulation method. This is avoiding sexual intercourse during  ovulation.  Symptothermal method. This is avoiding sexual intercourse during ovulation, using a thermometer and ovulation symptoms.  Post-ovulation method. This is timing sexual intercourse after you have ovulated. Regardless of which type or method of contraception you choose, it is important that you use condoms to protect against the transmission of sexually transmitted infections (STIs). Talk with your health care provider about which form of contraception is most appropriate for you. Document Released: 03/31/2005 Document Revised: 04/05/2013 Document Reviewed: 09/23/2012 [facility] Patient Information 2015 [place], [place]. This information is not intended to replace advice given to you by your health care provider. Make sure you discuss any questions you have with your health care provider.  Second Trimester of Pregnancy The second trimester is from week 13 through week 28, month 4 through 6. This is often the time in pregnancy that you feel your best. Often times, morning sickness has lessened or quit. You may have more energy, and you may get hungry more often. Your unborn baby (fetus) is growing rapidly. At the end of the sixth month, he or she is about 9 inches long and weighs about 1 pounds. You will likely feel the baby move (quickening) between 18 and 20 weeks of pregnancy. HOME CARE   Avoid all smoking, herbs, and alcohol. Avoid drugs not approved by your doctor.  Only take medicine as told by your doctor. Some  medicines are safe and some are not during pregnancy.  Exercise only as told by your doctor. Stop exercising if you start having cramps.  Eat regular, healthy meals.  Wear a good support bra if your breasts are tender.  Do not use hot tubs, steam rooms, or saunas.  Wear your seat belt when driving.  Avoid raw meat, uncooked cheese, and liter boxes and soil used by cats.  Take your prenatal vitamins.  Try taking medicine that helps you poop (stool softener) as needed,  and if your doctor approves. Eat more fiber by eating fresh fruit, vegetables, and whole grains. Drink enough fluids to keep your pee (urine) clear or pale yellow.  Take warm water baths (sitz baths) to soothe pain or discomfort caused by hemorrhoids. Use hemorrhoid cream if your doctor approves.  If you have puffy, bulging veins (varicose veins), wear support hose. Raise (elevate) your feet for 15 minutes, 3-4 times a day. Limit salt in your diet.  Avoid heavy lifting, wear low heals, and sit up straight.  Rest with your legs raised if you have leg cramps or low back pain.  Visit your dentist if you have not gone during your pregnancy. Use a soft toothbrush to brush your teeth. Be gentle when you floss.  You can have sex (intercourse) unless your doctor tells you not to.  Go to your doctor visits. GET HELP IF:   You feel dizzy.  You have mild cramps or pressure in your lower belly (abdomen).  You have a nagging pain in your belly area.  You continue to feel sick to your stomach (nauseous), throw up (vomit), or have watery poop (diarrhea).  You have bad smelling fluid coming from your vagina.  You have pain with peeing (urination). GET HELP RIGHT AWAY IF:   You have a fever.  You are leaking fluid from your vagina.  You have spotting or bleeding from your vagina.  You have severe belly cramping or pain.  You lose or gain weight rapidly.  You have trouble catching your breath and have chest pain.  You notice sudden or extreme puffiness (swelling) of your face, hands, ankles, feet, or legs.  You have not felt the baby move in over an hour.  You have severe headaches that do not go away with medicine.  You have vision changes. Document Released: 06/25/2009 Document Revised: 07/26/2012 Document Reviewed: 06/01/2012 [facility] Patient Information 2015 [place], [place]. This information is not intended to replace advice given to you by your health care provider. Make sure you  discuss any questions you have with your health care provider.  Tetanus, Diphtheria, Pertussis (Tdap) Vaccine What You Need to Know WHY GET VACCINATED? Tetanus, diphtheria and pertussis can be very serious diseases, even for adolescents and adults. Tdap vaccine can protect [place] from these diseases. TETANUS (Lockjaw) causes painful muscle tightening and stiffness, usually all over the body.  It can lead to tightening of muscles in the head and neck so you can't open your mouth, swallow, or sometimes even breathe. Tetanus kills about 1 out of 5 people who are infected. DIPHTHERIA can cause a thick coating to form in the back of the throat.  It can lead to breathing problems, paralysis, heart failure, and death. PERTUSSIS (Whooping Cough) causes severe coughing spells, which can cause difficulty breathing, vomiting and disturbed sleep.  It can also lead to weight loss, incontinence, and rib fractures. Up to 2 in 100 adolescents and 5 in 100 adults with pertussis are hospitalized or  have complications, which could include pneumonia and death. These diseases are caused by bacteria. Diphtheria and pertussis are spread from person to person through coughing or sneezing. Tetanus enters the body through cuts, scratches, or wounds. Before vaccines, the [place] saw as many as 200,000 cases a year of diphtheria and pertussis, and hundreds of cases of tetanus. Since vaccination began, tetanus and diphtheria have dropped by about 99% and pertussis by about 80%. TDAP VACCINE Tdap vaccine can protect adolescents and adults from tetanus, diphtheria, and pertussis. One dose of Tdap is routinely given at age 23 or 1912. People who did not get Tdap at that age should get it as soon as possible. Tdap is especially important for health care professionals and anyone having close contact with a baby younger than 12 months. Pregnant women should get a dose of Tdap during every pregnancy, to protect the newborn from  pertussis. Infants are most at risk for severe, life-threatening complications from pertussis. A similar vaccine, called Td, protects from tetanus and diphtheria, but not pertussis. A Td booster should be given every 10 years. Tdap may be given as one of these boosters if you have not already gotten a dose. Tdap may also be given after a severe cut or burn to prevent tetanus infection. Your doctor can give you more information. Tdap may safely be given at the same time as other vaccines. SOME PEOPLE SHOULD NOT GET THIS VACCINE  If you ever had a life-threatening allergic reaction after a dose of any tetanus, diphtheria, or pertussis containing vaccine, OR if you have a severe allergy to any part of this vaccine, you should not get Tdap. Tell your doctor if you have any severe allergies.  If you had a coma, or long or multiple seizures within 7 days after a childhood dose of DTP or DTaP, you should not get Tdap, unless a cause other than the vaccine was found. You can still get Td.  Talk to your doctor if you:  have epilepsy or another nervous system problem,  had severe pain or swelling after any vaccine containing diphtheria, tetanus or pertussis,  ever had Guillain-Barr Syndrome (GBS),  aren't feeling well on the day the shot is scheduled. RISKS OF A VACCINE REACTION With any medicine, including vaccines, there is a chance of side effects. These are usually mild and go away on their own, but serious reactions are also possible. Brief fainting spells can follow a vaccination, leading to injuries from falling. Sitting or lying down for about 15 minutes can help prevent these. Tell your doctor if you feel dizzy or light-headed, or have vision changes or ringing in the ears. Mild problems following Tdap (Did not interfere with activities)  Pain where the shot was given (about 3 in 4 adolescents or 2 in 3 adults)  Redness or swelling where the shot was given (about 1 person in 5)  Mild  fever of at least 100.57F (up to about 1 in 25 adolescents or 1 in 100 adults)  Headache (about 3 or 4 people in 10)  Tiredness (about 1 person in 3 or 4)  Nausea, vomiting, diarrhea, stomach ache (up to 1 in 4 adolescents or 1 in 10 adults)  Chills, body aches, sore joints, rash, swollen glands (uncommon) Moderate problems following Tdap (Interfered with activities, but did not require medical attention)  Pain where the shot was given (about 1 in 5 adolescents or 1 in 100 adults)  Redness or swelling where the shot was given (up  to about 1 in 16 adolescents or 1 in 25 adults)  Fever over 102F (about 1 in 100 adolescents or 1 in 250 adults)  Headache (about 3 in 20 adolescents or 1 in 10 adults)  Nausea, vomiting, diarrhea, stomach ache (up to 1 or 3 people in 100)  Swelling of the entire arm where the shot was given (up to about 3 in 100). Severe problems following Tdap (Unable to perform usual activities, required medical attention)  Swelling, severe pain, bleeding and redness in the arm where the shot was given (rare). A severe allergic reaction could occur after any vaccine (estimated less than 1 in a million doses). WHAT IF THERE IS A SERIOUS REACTION? What should I look for?  Look for anything that concerns you, such as signs of a severe allergic reaction, very high fever, or behavior changes. Signs of a severe allergic reaction can include hives, swelling of the face and throat, difficulty breathing, a fast heartbeat, dizziness, and weakness. These would start a few minutes to a few hours after the vaccination. What should I do?  If you think it is a severe allergic reaction or other emergency that can't wait, call 9-1-1 or get the person to the nearest hospital. Otherwise, call your doctor.  Afterward, the reaction should be reported to the "Vaccine Adverse Event Reporting System" (VAERS). Your doctor might file this report, or you can do it yourself through the VAERS web  site at www.vaers.LAgents.no, or by calling 1-406-580-7153. VAERS is only for reporting reactions. They do not give medical advice.  THE NATIONAL VACCINE INJURY COMPENSATION PROGRAM The National Vaccine Injury Compensation Program (VICP) is a federal program that was created to compensate people who may have been injured by certain vaccines. Persons who believe they may have been injured by a vaccine can learn about the program and about filing a claim by calling 1-661-021-7980 or visiting the VICP website at SpiritualWord.at. HOW CAN I LEARN MORE?  Ask your doctor.  Call your local or state health department.  Contact the Centers for Disease Control and Prevention (CDC):  Call 321-638-1073 or visit CDC's website at PicCapture.uy. CDC Tdap Vaccine VIS (08/21/11) Document Released: 09/30/2011 Document Revised: 07/26/2012 Document Reviewed: 07/21/2012 ExitCare Patient Information 2015 [place], [place]. This information is not intended to replace advice given to you by your health care provider. Make sure you discuss any questions you have with your health care provider.

## 2013-11-02 NOTE — Progress Notes (Signed)
Anatomy scan complete, normal. Enc CBE and BF classes.

## 2013-11-04 ENCOUNTER — Encounter: Payer: Self-pay | Admitting: General Practice

## 2013-11-30 ENCOUNTER — Encounter: Payer: Self-pay | Admitting: Advanced Practice Midwife

## 2013-11-30 ENCOUNTER — Ambulatory Visit (INDEPENDENT_AMBULATORY_CARE_PROVIDER_SITE_OTHER): Payer: PPO | Admitting: Advanced Practice Midwife

## 2013-11-30 VITALS — BP 117/62 | HR 79 | Wt 129.5 lb

## 2013-11-30 DIAGNOSIS — Z3400 Encounter for supervision of normal first pregnancy, unspecified trimester: Secondary | ICD-10-CM

## 2013-11-30 DIAGNOSIS — Z3402 Encounter for supervision of normal first pregnancy, second trimester: Secondary | ICD-10-CM

## 2013-11-30 DIAGNOSIS — Z23 Encounter for immunization: Secondary | ICD-10-CM

## 2013-11-30 DIAGNOSIS — N898 Other specified noninflammatory disorders of vagina: Secondary | ICD-10-CM

## 2013-11-30 DIAGNOSIS — O26892 Other specified pregnancy related conditions, second trimester: Secondary | ICD-10-CM

## 2013-11-30 DIAGNOSIS — O9989 Other specified diseases and conditions complicating pregnancy, childbirth and the puerperium: Secondary | ICD-10-CM

## 2013-11-30 LAB — CBC
HCT: 30.5 % — ABNORMAL LOW (ref 36.0–46.0)
Hemoglobin: 10.2 g/dL — ABNORMAL LOW (ref 12.0–15.0)
MCH: 25.6 pg — AB (ref 26.0–34.0)
MCHC: 33.4 g/dL (ref 30.0–36.0)
MCV: 76.6 fL — ABNORMAL LOW (ref 78.0–100.0)
Platelets: 220 10*3/uL (ref 150–400)
RBC: 3.98 MIL/uL (ref 3.87–5.11)
RDW: 14.3 % (ref 11.5–15.5)
WBC: 8.6 10*3/uL (ref 4.0–10.5)

## 2013-11-30 LAB — POCT URINALYSIS DIP (DEVICE)
Bilirubin Urine: NEGATIVE
Glucose, UA: NEGATIVE mg/dL
HGB URINE DIPSTICK: NEGATIVE
Ketones, ur: NEGATIVE mg/dL
LEUKOCYTES UA: NEGATIVE
NITRITE: NEGATIVE
PH: 5.5 (ref 5.0–8.0)
PROTEIN: NEGATIVE mg/dL
Specific Gravity, Urine: 1.015 (ref 1.005–1.030)
Urobilinogen, UA: 0.2 mg/dL (ref 0.0–1.0)

## 2013-11-30 MED ORDER — METRONIDAZOLE 0.75 % VA GEL: 1.0000 | Freq: Every day | VAGINAL | Status: DC

## 2013-11-30 MED ORDER — TETANUS-DIPHTH-ACELL PERTUSSIS 5-2.5-18.5 LF-MCG/0.5 IM SUSP: 0.5000 mL | Freq: Once | INTRAMUSCULAR | Status: AC

## 2013-11-30 NOTE — Progress Notes (Signed)
Doing well.  Good fetal movement, denies vaginal bleeding, LOF, regular contractions.  Does report malodorous vaginal discharge.  Denies STD risks.  Wet prep today. Symptoms/exam consistent with BV.  Metrogel Q hs x5 nights.

## 2013-11-30 NOTE — Progress Notes (Signed)
Patient reports vaginal odor with mucous discharge.  28 week labs today. Patient would like tdap.

## 2013-12-01 LAB — WET PREP, GENITAL
Clue Cells Wet Prep HPF POC: NONE SEEN
Trich, Wet Prep: NONE SEEN
WBC, Wet Prep HPF POC: NONE SEEN
Yeast Wet Prep HPF POC: NONE SEEN

## 2013-12-01 LAB — HIV ANTIBODY (ROUTINE TESTING W REFLEX): HIV 1&2 Ab, 4th Generation: NONREACTIVE

## 2013-12-01 LAB — GLUCOSE TOLERANCE, 1 HOUR (50G) W/O FASTING: Glucose, 1 Hour GTT: 109 mg/dL (ref 70–140)

## 2013-12-01 LAB — RPR

## 2013-12-14 ENCOUNTER — Ambulatory Visit (HOSPITAL_COMMUNITY)
Admission: RE | Admit: 2013-12-14 | Discharge: 2013-12-14 | Disposition: A | Discharge status: Home / Self Care | Payer: PPO | Source: Ambulatory Visit | Attending: Family | Admitting: Family

## 2013-12-14 ENCOUNTER — Ambulatory Visit (INDEPENDENT_AMBULATORY_CARE_PROVIDER_SITE_OTHER): Payer: PPO | Admitting: Family

## 2013-12-14 VITALS — BP 112/61 | HR 97 | Wt 135.6 lb

## 2013-12-14 DIAGNOSIS — Z3402 Encounter for supervision of normal first pregnancy, second trimester: Secondary | ICD-10-CM

## 2013-12-14 DIAGNOSIS — Z3689 Encounter for other specified antenatal screening: Secondary | ICD-10-CM | POA: Diagnosis not present

## 2013-12-14 DIAGNOSIS — O368190 Decreased fetal movements, unspecified trimester, not applicable or unspecified: Secondary | ICD-10-CM | POA: Insufficient documentation

## 2013-12-14 DIAGNOSIS — Z3400 Encounter for supervision of normal first pregnancy, unspecified trimester: Secondary | ICD-10-CM

## 2013-12-14 LAB — POCT URINALYSIS DIP (DEVICE)
BILIRUBIN URINE: NEGATIVE
Glucose, UA: NEGATIVE mg/dL
Hgb urine dipstick: NEGATIVE
Ketones, ur: NEGATIVE mg/dL
LEUKOCYTES UA: NEGATIVE
Nitrite: NEGATIVE
PH: 5 (ref 5.0–8.0)
PROTEIN: NEGATIVE mg/dL
SPECIFIC GRAVITY, URINE: 1.015 (ref 1.005–1.030)
Urobilinogen, UA: 0.2 mg/dL (ref 0.0–1.0)

## 2013-12-14 NOTE — Progress Notes (Signed)
Reports within the last week baby has been moving less-- last time felt baby move was last night.

## 2013-12-14 NOTE — Progress Notes (Signed)
Reports decreased movement in past day.  Reports feeling movement 1-2x/day.  Sent for BPP.

## 2013-12-28 ENCOUNTER — Ambulatory Visit (INDEPENDENT_AMBULATORY_CARE_PROVIDER_SITE_OTHER): Payer: PPO | Admitting: Advanced Practice Midwife

## 2013-12-28 VITALS — BP 104/56 | HR 91 | Temp 98.3°F | Wt 137.9 lb

## 2013-12-28 DIAGNOSIS — Z23 Encounter for immunization: Secondary | ICD-10-CM

## 2013-12-28 DIAGNOSIS — Z3403 Encounter for supervision of normal first pregnancy, third trimester: Secondary | ICD-10-CM

## 2013-12-28 DIAGNOSIS — Z3400 Encounter for supervision of normal first pregnancy, unspecified trimester: Secondary | ICD-10-CM

## 2013-12-28 LAB — POCT URINALYSIS DIP (DEVICE)
Bilirubin Urine: NEGATIVE
GLUCOSE, UA: NEGATIVE mg/dL
Hgb urine dipstick: NEGATIVE
KETONES UR: NEGATIVE mg/dL
Leukocytes, UA: NEGATIVE
Nitrite: NEGATIVE
PROTEIN: NEGATIVE mg/dL
SPECIFIC GRAVITY, URINE: 1.015 (ref 1.005–1.030)
UROBILINOGEN UA: 0.2 mg/dL (ref 0.0–1.0)
pH: 7 (ref 5.0–8.0)

## 2013-12-28 NOTE — Progress Notes (Signed)
Interpreter Rebekah Morse used for this encounter.  C/o intermittent of right sided abdominal pain.  Discussed flu vaccine-- desires it today.

## 2013-12-28 NOTE — Progress Notes (Signed)
Patient is feeling well, except for Epigastric pain that radiates to RUQ x2 days.  Pain is worse with eating and is associated with "reflux".  Likely GERD, advised patient to take Omeprazole  daily.  +FM, no bleeding/LOF, no contractions.  Questions answered about Mirena.

## 2013-12-28 NOTE — Patient Instructions (Signed)
Levonorgestrel intrauterine device (IUD) - Mirena What is this medicine? LEVONORGESTREL IUD (LEE voe nor jes trel) is a contraceptive (birth control) device. The device is placed inside the uterus by a healthcare professional. It is used to prevent pregnancy and can also be used to treat heavy bleeding that occurs during your period. Depending on the device, it can be used for 3 to 5 years. This medicine may be used for other purposes; ask your health care provider or pharmacist if you have questions. COMMON BRAND NAME(S): LILETTA, Mirena, Skyla What should I tell my health care provider before I take this medicine? They need to know if you have any of these conditions: -abnormal Pap smear -cancer of the breast, uterus, or cervix -diabetes -endometritis -genital or pelvic infection now or in the past -have more than one sexual partner or your partner has more than one partner -heart disease -history of an ectopic or tubal pregnancy -immune system problems -IUD in place -liver disease or tumor -problems with blood clots or take blood-thinners -use intravenous drugs -uterus of unusual shape -vaginal bleeding that has not been explained -an unusual or allergic reaction to levonorgestrel, other hormones, silicone, or polyethylene, medicines, foods, dyes, or preservatives -pregnant or trying to get pregnant -breast-feeding How should I use this medicine? This device is placed inside the uterus by a health care professional. Talk to your pediatrician regarding the use of this medicine in children. Special care may be needed. Overdosage: If you think you have taken too much of this medicine contact a poison control center or emergency room at once. NOTE: This medicine is only for you. Do not share this medicine with others. What if I miss a dose? This does not apply. What may interact with this medicine? Do not take this medicine with any of the following  medications: -amprenavir -bosentan -fosamprenavir This medicine may also interact with the following medications: -aprepitant -barbiturate medicines for inducing sleep or treating seizures -bexarotene -griseofulvin -medicines to treat seizures like carbamazepine, ethotoin, felbamate, oxcarbazepine, phenytoin, topiramate -modafinil -pioglitazone -rifabutin -rifampin -rifapentine -some medicines to treat HIV infection like atazanavir, indinavir, lopinavir, nelfinavir, tipranavir, ritonavir -St. John's wort -warfarin This list may not describe all possible interactions. Give your health care provider a list of all the medicines, herbs, non-prescription drugs, or dietary supplements you use. Also tell them if you smoke, drink alcohol, or use illegal drugs. Some items may interact with your medicine. What should I watch for while using this medicine? Visit your doctor or health care professional for regular check ups. See your doctor if you or your partner has sexual contact with others, becomes HIV positive, or gets a sexual transmitted disease. This product does not protect you against HIV infection (AIDS) or other sexually transmitted diseases. You can check the placement of the IUD yourself by reaching up to the top of your vagina with clean fingers to feel the threads. Do not pull on the threads. It is a good habit to check placement after each menstrual period. Call your doctor right away if you feel more of the IUD than just the threads or if you cannot feel the threads at all. The IUD may come out by itself. You may become pregnant if the device comes out. If you notice that the IUD has come out use a backup birth control method like condoms and call your health care provider. Using tampons will not change the position of the IUD and are okay to use during your period. What side   effects may I notice from receiving this medicine? Side effects that you should report to your doctor or  health care professional as soon as possible: -allergic reactions like skin rash, itching or hives, swelling of the face, lips, or tongue -fever, flu-like symptoms -genital sores -high blood pressure -no menstrual period for 6 weeks during use -pain, swelling, warmth in the leg -pelvic pain or tenderness -severe or sudden headache -signs of pregnancy -stomach cramping -sudden shortness of breath -trouble with balance, talking, or walking -unusual vaginal bleeding, discharge -yellowing of the eyes or skin Side effects that usually do not require medical attention (report to your doctor or health care professional if they continue or are bothersome): -acne -breast pain -change in sex drive or performance -changes in weight -cramping, dizziness, or faintness while the device is being inserted -headache -irregular menstrual bleeding within first 3 to 6 months of use -nausea This list may not describe all possible side effects. Call your doctor for medical advice about side effects. You may report side effects to FDA at 1-800-FDA-1088. Where should I keep my medicine? This does not apply. NOTE: This sheet is a summary. It may not cover all possible information. If you have questions about this medicine, talk to your doctor, pharmacist, or health care provider.  2015, [employer]. (2011-05-01 13:54:04)   Preterm Labor Information Preterm labor is when labor starts before you are [redacted] weeks pregnant. The normal length of pregnancy is 39 to 41 weeks.  CAUSES  The cause of preterm labor is not often known. The most common known cause is infection. RISK FACTORS  Having a history of preterm labor.  Having your water break before it should.  Having a placenta that covers the opening of the cervix.  Having a placenta that breaks away from the uterus.  Having a cervix that is too weak to hold the baby in the uterus.  Having too much fluid in the amniotic sac.  Taking  drugs or smoking while pregnant.  Not gaining enough weight while pregnant.  Being younger than 18 and older than 23 years old.  Having a low income.  Being African American. SYMPTOMS  Period-like cramps, belly (abdominal) pain, or back pain.  Contractions that are regular, as often as six in an hour. They may be mild or painful.  Contractions that start at the top of the belly. They then move to the lower belly and back.  Lower belly pressure that seems to get stronger.  Bleeding from the vagina.  Fluid leaking from the vagina. TREATMENT  Treatment depends on:  Your condition.  The condition of your baby.  How many weeks pregnant you are. Your doctor may have you:  Take medicine to stop contractions.  Stay in bed except to use the restroom (bed rest).  Stay in the hospital. WHAT SHOULD YOU DO IF YOU THINK YOU ARE IN PRETERM LABOR? Call your doctor right away. You need to go to the hospital right away.  HOW CAN YOU PREVENT PRETERM LABOR IN FUTURE PREGNANCIES?  Stop smoking, if you smoke.  Maintain healthy weight gain.  Do not take drugs or be around chemicals that are not needed.  Tell your doctor if you think you have an infection.  Tell your doctor if you had a preterm labor before. Document Released: 06/27/2008 Document Revised: 01/19/2013 Document Reviewed: 06/27/2008 ExitCare Patient Information 2015 [employer]. This information is not intended to replace advice given to you by your health care provider. Make sure you   any questions you have with your health care provider.   

## 2014-01-11 ENCOUNTER — Ambulatory Visit (INDEPENDENT_AMBULATORY_CARE_PROVIDER_SITE_OTHER): Payer: PPO | Admitting: Advanced Practice Midwife

## 2014-01-11 ENCOUNTER — Encounter: Payer: Self-pay | Admitting: Advanced Practice Midwife

## 2014-01-11 VITALS — BP 108/58 | HR 91 | Wt 140.5 lb

## 2014-01-11 DIAGNOSIS — Z3403 Encounter for supervision of normal first pregnancy, third trimester: Secondary | ICD-10-CM

## 2014-01-11 DIAGNOSIS — J301 Allergic rhinitis due to pollen: Secondary | ICD-10-CM

## 2014-01-11 DIAGNOSIS — Z3400 Encounter for supervision of normal first pregnancy, unspecified trimester: Secondary | ICD-10-CM

## 2014-01-11 LAB — POCT URINALYSIS DIP (DEVICE)
BILIRUBIN URINE: NEGATIVE
GLUCOSE, UA: NEGATIVE mg/dL
HGB URINE DIPSTICK: NEGATIVE
Ketones, ur: NEGATIVE mg/dL
LEUKOCYTES UA: NEGATIVE
NITRITE: NEGATIVE
Protein, ur: NEGATIVE mg/dL
Specific Gravity, Urine: 1.015 (ref 1.005–1.030)
Urobilinogen, UA: 0.2 mg/dL (ref 0.0–1.0)
pH: 7.5 (ref 5.0–8.0)

## 2014-01-11 MED ORDER — LORATADINE 10 MG PO TABS: 10.0000 mg | ORAL_TABLET | Freq: Every day | ORAL | Status: AC

## 2014-01-11 NOTE — Progress Notes (Signed)
Doing well. Some congestion and sore throat, probably allergic. Rx Claritin.   Worried about breech. Feels vertex by Leopolds. Will check with cultures at next visit Interpretor present. Husband also speaks pretty good [place]

## 2014-01-11 NOTE — Patient Instructions (Signed)
Third Trimester of Pregnancy The third trimester is from week 29 through week 42, months 7 through 9. The third trimester is a time when the fetus is growing rapidly. At the end of the ninth month, the fetus is about 20 inches in length and weighs 6-10 pounds.  BODY CHANGES Your body goes through many changes during pregnancy. The changes vary from woman to woman.   Your weight will continue to increase. You can expect to gain 25-35 pounds (11-16 kg) by the end of the pregnancy.  You may begin to get stretch marks on your hips, abdomen, and breasts.  You may urinate more often because the fetus is moving lower into your pelvis and pressing on your bladder.  You may develop or continue to have heartburn as a result of your pregnancy.  You may develop constipation because certain hormones are causing the muscles that push waste through your intestines to slow down.  You may develop hemorrhoids or swollen, bulging veins (varicose veins).  You may have pelvic pain because of the weight gain and pregnancy hormones relaxing your joints between the bones in your pelvis. Backaches may result from overexertion of the muscles supporting your posture.  You may have changes in your hair. These can include thickening of your hair, rapid growth, and changes in texture. Some women also have hair loss during or after pregnancy, or hair that feels dry or thin. Your hair will most likely return to normal after your baby is born.  Your breasts will continue to grow and be tender. A yellow discharge may leak from your breasts called colostrum.  Your belly button may stick out.  You may feel short of breath because of your expanding uterus.  You may notice the fetus "dropping," or moving lower in your abdomen.  You may have a bloody mucus discharge. This usually occurs a few days to a week before labor begins.  Your cervix becomes thin and soft (effaced) near your due date. WHAT TO EXPECT AT YOUR PRENATAL  EXAMS  You will have prenatal exams every 2 weeks until week 36. Then, you will have weekly prenatal exams. During a routine prenatal visit:  You will be weighed to make sure you and the fetus are growing normally.  Your blood pressure is taken.  Your abdomen will be measured to track your baby's growth.  The fetal heartbeat will be listened to.  Any test results from the previous visit will be discussed.  You may have a cervical check near your due date to see if you have effaced. At around 36 weeks, your caregiver will check your cervix. At the same time, your caregiver will also perform a test on the secretions of the vaginal tissue. This test is to determine if a type of bacteria, Group B streptococcus, is present. Your caregiver will explain this further. Your caregiver may ask you:  What your birth plan is.  How you are feeling.  If you are feeling the baby move.  If you have had any abnormal symptoms, such as leaking fluid, bleeding, severe headaches, or abdominal cramping.  If you have any questions. Other tests or screenings that may be performed during your third trimester include:  Blood tests that check for low iron levels (anemia).  Fetal testing to check the health, activity level, and growth of the fetus. Testing is done if you have certain medical conditions or if there are problems during the pregnancy. FALSE LABOR You may feel small, irregular contractions that   eventually go away. These are called Braxton Hicks contractions, or false labor. Contractions may last for hours, days, or even weeks before true labor sets in. If contractions come at regular intervals, intensify, or become painful, it is best to be seen by your caregiver.  SIGNS OF LABOR   Menstrual-like cramps.  Contractions that are 5 minutes apart or less.  Contractions that start on the top of the uterus and spread down to the lower abdomen and back.  A sense of increased pelvic pressure or back  pain.  A watery or bloody mucus discharge that comes from the vagina. If you have any of these signs before the 37th week of pregnancy, call your caregiver right away. You need to go to the hospital to get checked immediately. HOME CARE INSTRUCTIONS   Avoid all smoking, herbs, alcohol, and unprescribed drugs. These chemicals affect the formation and growth of the baby.  Follow your caregiver's instructions regarding medicine use. There are medicines that are either safe or unsafe to take during pregnancy.  Exercise only as directed by your caregiver. Experiencing uterine cramps is a good sign to stop exercising.  Continue to eat regular, healthy meals.  Wear a good support bra for breast tenderness.  Do not use hot tubs, steam rooms, or saunas.  Wear your seat belt at all times when driving.  Avoid raw meat, uncooked cheese, cat litter boxes, and soil used by cats. These carry germs that can cause birth defects in the baby.  Take your prenatal vitamins.  Try taking a stool softener (if your caregiver approves) if you develop constipation. Eat more high-fiber foods, such as fresh vegetables or fruit and whole grains. Drink plenty of fluids to keep your urine clear or pale yellow.  Take warm sitz baths to soothe any pain or discomfort caused by hemorrhoids. Use hemorrhoid cream if your caregiver approves.  If you develop varicose veins, wear support hose. Elevate your feet for 15 minutes, 3-4 times a day. Limit salt in your diet.  Avoid heavy lifting, wear low heal shoes, and practice good posture.  Rest a lot with your legs elevated if you have leg cramps or low back pain.  Visit your dentist if you have not gone during your pregnancy. Use a soft toothbrush to brush your teeth and be gentle when you floss.  A sexual relationship may be continued unless your caregiver directs you otherwise.  Do not travel far distances unless it is absolutely necessary and only with the approval  of your caregiver.  Take prenatal classes to understand, practice, and ask questions about the labor and delivery.  Make a trial run to the hospital.  Pack your hospital bag.  Prepare the baby's nursery.  Continue to go to all your prenatal visits as directed by your caregiver. SEEK MEDICAL CARE IF:  You are unsure if you are in labor or if your water has broken.  You have dizziness.  You have mild pelvic cramps, pelvic pressure, or nagging pain in your abdominal area.  You have persistent nausea, vomiting, or diarrhea.  You have a bad smelling vaginal discharge.  You have pain with urination. SEEK IMMEDIATE MEDICAL CARE IF:   You have a fever.  You are leaking fluid from your vagina.  You have spotting or bleeding from your vagina.  You have severe abdominal cramping or pain.  You have rapid weight loss or gain.  You have shortness of breath with chest pain.  You notice sudden or extreme swelling   of your face, hands, ankles, feet, or legs.  You have not felt your baby move in over an hour.  You have severe headaches that do not go away with medicine.  You have vision changes. Document Released: 03/25/2001 Document Revised: 04/05/2013 Document Reviewed: 06/01/2012 ExitCare Patient Information 2015 [employer]. This information is not intended to replace advice given to you by your health care provider. Make sure you discuss any questions you have with your health care provider.  

## 2014-01-25 ENCOUNTER — Other Ambulatory Visit: Payer: Self-pay | Admitting: Advanced Practice Midwife

## 2014-01-25 ENCOUNTER — Ambulatory Visit (INDEPENDENT_AMBULATORY_CARE_PROVIDER_SITE_OTHER): Payer: PPO | Admitting: Advanced Practice Midwife

## 2014-01-25 VITALS — BP 107/59 | HR 80 | Wt 142.8 lb

## 2014-01-25 DIAGNOSIS — Z3403 Encounter for supervision of normal first pregnancy, third trimester: Secondary | ICD-10-CM

## 2014-01-25 LAB — POCT URINALYSIS DIP (DEVICE)
BILIRUBIN URINE: NEGATIVE
GLUCOSE, UA: NEGATIVE mg/dL
Hgb urine dipstick: NEGATIVE
Ketones, ur: NEGATIVE mg/dL
LEUKOCYTES UA: NEGATIVE
NITRITE: NEGATIVE
PH: 5.5 (ref 5.0–8.0)
Protein, ur: NEGATIVE mg/dL
Specific Gravity, Urine: 1.01 (ref 1.005–1.030)
Urobilinogen, UA: 0.2 mg/dL (ref 0.0–1.0)

## 2014-01-25 LAB — OB RESULTS CONSOLE GC/CHLAMYDIA
Chlamydia: NEGATIVE
GC PROBE AMP, GENITAL: NEGATIVE

## 2014-01-25 LAB — OB RESULTS CONSOLE GBS: GBS: NEGATIVE

## 2014-01-25 NOTE — Progress Notes (Signed)
Doing well.  Good fetal movement, denies vaginal bleeding, LOF, regular contractions.  GBS/GCC collected today.  Vertex by leopolds.

## 2014-01-25 NOTE — Addendum Note (Signed)
Addended by: Candelaria StagersHAIZLIP, Kaileah Shevchenko E on: 01/25/2014 10:56 AM   Modules accepted: Orders

## 2014-01-26 LAB — GC/CHLAMYDIA PROBE AMP
CT Probe RNA: NEGATIVE
GC PROBE AMP APTIMA: NEGATIVE

## 2014-01-27 LAB — CULTURE, BETA STREP (GROUP B ONLY)

## 2014-02-01 ENCOUNTER — Encounter: Payer: Self-pay | Admitting: Advanced Practice Midwife

## 2014-02-01 ENCOUNTER — Encounter: Payer: PPO | Admitting: Advanced Practice Midwife

## 2014-02-08 ENCOUNTER — Ambulatory Visit (INDEPENDENT_AMBULATORY_CARE_PROVIDER_SITE_OTHER): Payer: PPO | Admitting: Advanced Practice Midwife

## 2014-02-08 VITALS — BP 110/70 | HR 94 | Temp 98.6°F | Wt 145.2 lb

## 2014-02-08 DIAGNOSIS — Z3403 Encounter for supervision of normal first pregnancy, third trimester: Secondary | ICD-10-CM

## 2014-02-08 LAB — POCT URINALYSIS DIP (DEVICE)
Bilirubin Urine: NEGATIVE
Glucose, UA: NEGATIVE mg/dL
Hgb urine dipstick: NEGATIVE
Ketones, ur: NEGATIVE mg/dL
Leukocytes, UA: NEGATIVE
Nitrite: NEGATIVE
PROTEIN: NEGATIVE mg/dL
Specific Gravity, Urine: 1.02 (ref 1.005–1.030)
UROBILINOGEN UA: 0.2 mg/dL (ref 0.0–1.0)
pH: 6 (ref 5.0–8.0)

## 2014-02-08 NOTE — Progress Notes (Signed)
GBS neg. Increased pelvis pressure. Declines exam.

## 2014-02-08 NOTE — Progress Notes (Signed)
Reports pain in hips.

## 2014-02-08 NOTE — Patient Instructions (Signed)
Braxton Hicks Contractions Contractions of the uterus can occur throughout pregnancy. Contractions are not always a sign that you are in labor.  WHAT ARE BRAXTON HICKS CONTRACTIONS?  Contractions that occur before labor are called Braxton Hicks contractions, or false labor. Toward the end of pregnancy (32-34 weeks), these contractions can develop more often and may become more forceful. This is not true labor because these contractions do not result in opening (dilatation) and thinning of the cervix. They are sometimes difficult to tell apart from true labor because these contractions can be forceful and people have different pain tolerances. You should not feel embarrassed if you go to the hospital with false labor. Sometimes, the only way to tell if you are in true labor is for your health care provider to look for changes in the cervix. If there are no prenatal problems or other health problems associated with the pregnancy, it is completely safe to be sent home with false labor and await the onset of true labor. HOW CAN YOU TELL THE DIFFERENCE BETWEEN TRUE AND FALSE LABOR? False Labor  The contractions of false labor are usually shorter and not as hard as those of true labor.   The contractions are usually irregular.   The contractions are often felt in the front of the lower abdomen and in the groin.   The contractions may go away when you walk around or change positions while lying down.   The contractions get weaker and are shorter lasting as time goes on.   The contractions do not usually become progressively stronger, regular, and closer together as with true labor.  True Labor  Contractions in true labor last 30-70 seconds, become very regular, usually become more intense, and increase in frequency.   The contractions do not go away with walking.   The discomfort is usually felt in the top of the uterus and spreads to the lower abdomen and low back.   True labor can be  determined by your health care provider with an exam. This will show that the cervix is dilating and getting thinner.  WHAT TO REMEMBER  Keep up with your usual exercises and follow other instructions given by your health care provider.   Take medicines as directed by your health care provider.   Keep your regular prenatal appointments.   Eat and drink lightly if you think you are going into labor.   If Braxton Hicks contractions are making you uncomfortable:   Change your position from lying down or resting to walking, or from walking to resting.   Sit and rest in a tub of warm water.   Drink 2-3 glasses of water. Dehydration may cause these contractions.   Do slow and deep breathing several times an hour.  WHEN SHOULD I SEEK IMMEDIATE MEDICAL CARE? Seek immediate medical care if:  Your contractions become stronger, more regular, and closer together.   You have fluid leaking or gushing from your vagina.   You have a fever.   You pass blood-tinged mucus.   You have vaginal bleeding.   You have continuous abdominal pain.   You have low back pain that you never had before.   You feel your baby's head pushing down and causing pelvic pressure.   Your baby is not moving as much as it used to.  Document Released: 03/31/2005 Document Revised: 04/05/2013 Document Reviewed: 01/10/2013 Exit[employer] Patient Information 2015 [employer]. This information is not intended to replace advice given to you by your health care   provider. Make sure you discuss any questions you have with your health care provider.  Fetal Movement Counts Patient Name: __________________________________________________ Patient Due Date: ____________________ Performing a fetal movement count is highly recommended in high-risk pregnancies, but it is good for every pregnant woman to do. Your health care provider may ask you to start counting fetal movements at 28 weeks of the pregnancy. Fetal  movements often increase:  After eating a full meal.  After physical activity.  After eating or drinking something sweet or cold.  At rest. Pay attention to when you feel the baby is most active. This will help you notice a pattern of your baby's sleep and wake cycles and what factors contribute to an increase in fetal movement. It is important to perform a fetal movement count at the same time each day when your baby is normally most active.  HOW TO COUNT FETAL MOVEMENTS 1. Find a quiet and comfortable area to sit or lie down on your left side. Lying on your left side provides the best blood and oxygen circulation to your baby. 2. Write down the day and time on a sheet of paper or in a journal. 3. Start counting kicks, flutters, swishes, rolls, or jabs in a 2-hour period. You should feel at least 10 movements within 2 hours. 4. If you do not feel 10 movements in 2 hours, wait 2-3 hours and count again. Look for a change in the pattern or not enough counts in 2 hours. SEEK MEDICAL CARE IF:  You feel less than 10 counts in 2 hours, tried twice.  There is no movement in over an hour.  The pattern is changing or taking longer each day to reach 10 counts in 2 hours.  You feel the baby is not moving as he or she usually does. Date: ____________ Movements: ____________ Start time: ____________ Finish time: ____________  Date: ____________ Movements: ____________ Start time: ____________ Finish time: ____________ Date: ____________ Movements: ____________ Start time: ____________ Finish time: ____________ Date: ____________ Movements: ____________ Start time: ____________ Finish time: ____________ Date: ____________ Movements: ____________ Start time: ____________ Finish time: ____________ Date: ____________ Movements: ____________ Start time: ____________ Finish time: ____________ Date: ____________ Movements: ____________ Start time: ____________ Finish time: ____________ Date: ____________  Movements: ____________ Start time: ____________ Finish time: ____________  Date: ____________ Movements: ____________ Start time: ____________ Finish time: ____________ Date: ____________ Movements: ____________ Start time: ____________ Finish time: ____________ Date: ____________ Movements: ____________ Start time: ____________ Finish time: ____________ Date: ____________ Movements: ____________ Start time: ____________ Finish time: ____________ Date: ____________ Movements: ____________ Start time: ____________ Finish time: ____________ Date: ____________ Movements: ____________ Start time: ____________ Finish time: ____________ Date: ____________ Movements: ____________ Start time: ____________ Finish time: ____________  Date: ____________ Movements: ____________ Start time: ____________ Finish time: ____________ Date: ____________ Movements: ____________ Start time: ____________ Finish time: ____________ Date: ____________ Movements: ____________ Start time: ____________ Finish time: ____________ Date: ____________ Movements: ____________ Start time: ____________ Finish time: ____________ Date: ____________ Movements: ____________ Start time: ____________ Finish time: ____________ Date: ____________ Movements: ____________ Start time: ____________ Finish time: ____________ Date: ____________ Movements: ____________ Start time: ____________ Finish time: ____________  Date: ____________ Movements: ____________ Start time: ____________ Finish time: ____________ Date: ____________ Movements: ____________ Start time: ____________ Finish time: ____________ Date: ____________ Movements: ____________ Start time: ____________ Finish time: ____________ Date: ____________ Movements: ____________ Start time: ____________ Finish time: ____________ Date: ____________ Movements: ____________ Start time: ____________ Finish time: ____________ Date: ____________ Movements: ____________ Start time:  ____________ Finish time: ____________ Date: ____________ Movements:   ____________ Start time: ____________ Finish time: ____________  Date: ____________ Movements: ____________ Start time: ____________ Finish time: ____________ Date: ____________ Movements: ____________ Start time: ____________ Finish time: ____________ Date: ____________ Movements: ____________ Start time: ____________ Finish time: ____________ Date: ____________ Movements: ____________ Start time: ____________ Finish time: ____________ Date: ____________ Movements: ____________ Start time: ____________ Finish time: ____________ Date: ____________ Movements: ____________ Start time: ____________ Finish time: ____________ Date: ____________ Movements: ____________ Start time: ____________ Finish time: ____________  Date: ____________ Movements: ____________ Start time: ____________ Finish time: ____________ Date: ____________ Movements: ____________ Start time: ____________ Finish time: ____________ Date: ____________ Movements: ____________ Start time: ____________ Finish time: ____________ Date: ____________ Movements: ____________ Start time: ____________ Finish time: ____________ Date: ____________ Movements: ____________ Start time: ____________ Finish time: ____________ Date: ____________ Movements: ____________ Start time: ____________ Finish time: ____________ Date: ____________ Movements: ____________ Start time: ____________ Finish time: ____________  Date: ____________ Movements: ____________ Start time: ____________ Finish time: ____________ Date: ____________ Movements: ____________ Start time: ____________ Finish time: ____________ Date: ____________ Movements: ____________ Start time: ____________ Finish time: ____________ Date: ____________ Movements: ____________ Start time: ____________ Finish time: ____________ Date: ____________ Movements: ____________ Start time: ____________ Finish time: ____________ Date:  ____________ Movements: ____________ Start time: ____________ Finish time: ____________ Date: ____________ Movements: ____________ Start time: ____________ Finish time: ____________  Date: ____________ Movements: ____________ Start time: ____________ Finish time: ____________ Date: ____________ Movements: ____________ Start time: ____________ Finish time: ____________ Date: ____________ Movements: ____________ Start time: ____________ Finish time: ____________ Date: ____________ Movements: ____________ Start time: ____________ Finish time: ____________ Date: ____________ Movements: ____________ Start time: ____________ Finish time: ____________ Date: ____________ Movements: ____________ Start time: ____________ Finish time: ____________ Document Released: 04/30/2006 Document Revised: 08/15/2013 Document Reviewed: 01/26/2012 Exit[employer] Patient Information 2015 [employer]. This information is not intended to replace advice given to you by your health care provider. Make sure you discuss any questions you have with your health care provider.  

## 2014-02-13 ENCOUNTER — Encounter: Payer: Self-pay | Admitting: Advanced Practice Midwife

## 2014-02-14 ENCOUNTER — Ambulatory Visit (INDEPENDENT_AMBULATORY_CARE_PROVIDER_SITE_OTHER): Payer: PPO | Admitting: Advanced Practice Midwife

## 2014-02-14 ENCOUNTER — Encounter: Payer: Self-pay | Admitting: Advanced Practice Midwife

## 2014-02-14 VITALS — BP 107/54 | HR 95 | Temp 98.5°F | Wt 145.5 lb

## 2014-02-14 DIAGNOSIS — Z3403 Encounter for supervision of normal first pregnancy, third trimester: Secondary | ICD-10-CM

## 2014-02-14 LAB — POCT URINALYSIS DIP (DEVICE)
BILIRUBIN URINE: NEGATIVE
Glucose, UA: NEGATIVE mg/dL
Hgb urine dipstick: NEGATIVE
Ketones, ur: NEGATIVE mg/dL
Leukocytes, UA: NEGATIVE
NITRITE: NEGATIVE
Protein, ur: NEGATIVE mg/dL
Specific Gravity, Urine: 1.015 (ref 1.005–1.030)
UROBILINOGEN UA: 0.2 mg/dL (ref 0.0–1.0)
pH: 5.5 (ref 5.0–8.0)

## 2014-02-14 NOTE — Progress Notes (Signed)
Having a few mild contractions, did not know they were contractions. Labor explained via interpretor. GBS Neg

## 2014-02-14 NOTE — Progress Notes (Signed)
Hassie BruceFeryl Yousef used as interpreter for this encounter.

## 2014-02-14 NOTE — Patient Instructions (Signed)

## 2014-02-21 ENCOUNTER — Ambulatory Visit (INDEPENDENT_AMBULATORY_CARE_PROVIDER_SITE_OTHER): Payer: PPO | Admitting: Advanced Practice Midwife

## 2014-02-21 VITALS — BP 102/64 | HR 90 | Temp 98.0°F | Wt 147.6 lb

## 2014-02-21 DIAGNOSIS — O368130 Decreased fetal movements, third trimester, not applicable or unspecified: Secondary | ICD-10-CM

## 2014-02-21 DIAGNOSIS — O368131 Decreased fetal movements, third trimester, fetus 1: Secondary | ICD-10-CM

## 2014-02-21 LAB — POCT URINALYSIS DIP (DEVICE)
BILIRUBIN URINE: NEGATIVE
GLUCOSE, UA: NEGATIVE mg/dL
Hgb urine dipstick: NEGATIVE
KETONES UR: NEGATIVE mg/dL
Nitrite: NEGATIVE
Protein, ur: NEGATIVE mg/dL
Specific Gravity, Urine: 1.015 (ref 1.005–1.030)
Urobilinogen, UA: 0.2 mg/dL (ref 0.0–1.0)
pH: 7.5 (ref 5.0–8.0)

## 2014-02-21 NOTE — Progress Notes (Signed)
Used interpreterFeryal Morse.   States feeling baby move less than before for the last 2 days- states only felt baby move 2 times a day and less strong, before that was more often and was much stronger.

## 2014-02-21 NOTE — Progress Notes (Signed)
Doing well.  Good fetal movement since NST today, denies vaginal bleeding, LOF, regular contractions.  Reviewed NST results with pt, reassurance provided.  Anticipatory guidance provided about next visits, what happens if she goes postdates, etc.  Pt does not desire cervical exam today, but will consider exam/membrane sweeping at next visit.  Reviewed signs of labor/warning signs/reasons to come to [facility].

## 2014-02-28 ENCOUNTER — Ambulatory Visit (INDEPENDENT_AMBULATORY_CARE_PROVIDER_SITE_OTHER): Payer: PPO | Admitting: Advanced Practice Midwife

## 2014-02-28 ENCOUNTER — Encounter: Payer: Self-pay | Admitting: Advanced Practice Midwife

## 2014-02-28 VITALS — BP 117/70 | HR 96 | Temp 98.3°F | Wt 150.8 lb

## 2014-02-28 DIAGNOSIS — Z3403 Encounter for supervision of normal first pregnancy, third trimester: Secondary | ICD-10-CM

## 2014-02-28 LAB — POCT URINALYSIS DIP (DEVICE)
BILIRUBIN URINE: NEGATIVE
Glucose, UA: NEGATIVE mg/dL
Hgb urine dipstick: NEGATIVE
KETONES UR: NEGATIVE mg/dL
Nitrite: NEGATIVE
Protein, ur: NEGATIVE mg/dL
Specific Gravity, Urine: 1.025 (ref 1.005–1.030)
Urobilinogen, UA: 0.2 mg/dL (ref 0.0–1.0)
pH: 6.5 (ref 5.0–8.0)

## 2014-02-28 NOTE — Progress Notes (Signed)
Discussed labor. Membranes swept per request. Discussed NST next week with IOL recommended at 41.5 wks. Interpretor present Magazine features editor(Yousef)

## 2014-02-28 NOTE — Progress Notes (Signed)
Rebekah ShellerFeryal Yourf Interpreter. Pt is concerned about going over due date and what will happen next.

## 2014-02-28 NOTE — Patient Instructions (Signed)

## 2014-03-01 ENCOUNTER — Inpatient Hospital Stay (HOSPITAL_COMMUNITY): Payer: PPO | Admitting: Anesthesiology

## 2014-03-01 ENCOUNTER — Encounter (HOSPITAL_COMMUNITY): Payer: Self-pay | Admitting: *Deleted

## 2014-03-01 ENCOUNTER — Inpatient Hospital Stay (HOSPITAL_COMMUNITY)
Admission: AD | Admit: 2014-03-01 | Discharge: 2014-03-03 | DRG: 775 | Disposition: A | Discharge status: Home / Self Care | Payer: PPO | Source: Ambulatory Visit | Attending: Obstetrics and Gynecology | Admitting: Obstetrics and Gynecology

## 2014-03-01 DIAGNOSIS — Z3403 Encounter for supervision of normal first pregnancy, third trimester: Secondary | ICD-10-CM

## 2014-03-01 DIAGNOSIS — O2653 Maternal hypotension syndrome, third trimester: Secondary | ICD-10-CM | POA: Diagnosis present

## 2014-03-01 DIAGNOSIS — Z3A40 40 weeks gestation of pregnancy: Secondary | ICD-10-CM | POA: Diagnosis present

## 2014-03-01 DIAGNOSIS — D649 Anemia, unspecified: Secondary | ICD-10-CM | POA: Diagnosis present

## 2014-03-01 DIAGNOSIS — IMO0001 Reserved for inherently not codable concepts without codable children: Secondary | ICD-10-CM

## 2014-03-01 DIAGNOSIS — O9902 Anemia complicating childbirth: Secondary | ICD-10-CM | POA: Diagnosis present

## 2014-03-01 DIAGNOSIS — O9989 Other specified diseases and conditions complicating pregnancy, childbirth and the puerperium: Secondary | ICD-10-CM | POA: Diagnosis present

## 2014-03-01 HISTORY — DX: Other specified health status: Z78.9

## 2014-03-01 LAB — CBC
HCT: 34.1 % — ABNORMAL LOW (ref 36.0–46.0)
HEMOGLOBIN: 10.9 g/dL — AB (ref 12.0–15.0)
MCH: 24 pg — ABNORMAL LOW (ref 26.0–34.0)
MCHC: 32 g/dL (ref 30.0–36.0)
MCV: 74.9 fL — ABNORMAL LOW (ref 78.0–100.0)
Platelets: 180 10*3/uL (ref 150–400)
RBC: 4.55 MIL/uL (ref 3.87–5.11)
RDW: 15.6 % — AB (ref 11.5–15.5)
WBC: 10 10*3/uL (ref 4.0–10.5)

## 2014-03-01 LAB — TYPE AND SCREEN
ABO/RH(D): A POS
Antibody Screen: NEGATIVE

## 2014-03-01 LAB — RPR

## 2014-03-01 LAB — ABO/RH: ABO/RH(D): A POS

## 2014-03-01 MED ORDER — FENTANYL 2.5 MCG/ML BUPIVACAINE 1/10 % EPIDURAL INFUSION (WH - ANES)
14.0000 mL/h | INTRAMUSCULAR | Status: DC | PRN
  Administered 2014-03-01 (×2): 14 mL/h via EPIDURAL
  Filled 2014-03-01 (×2): qty 125

## 2014-03-01 MED ORDER — EPHEDRINE 5 MG/ML INJ
10.0000 mg | INTRAVENOUS | Status: DC | PRN
  Filled 2014-03-01: qty 2

## 2014-03-01 MED ORDER — DIPHENHYDRAMINE HCL 50 MG/ML IJ SOLN: 12.5000 mg | INTRAMUSCULAR | Status: DC | PRN

## 2014-03-01 MED ORDER — ZOLPIDEM TARTRATE 5 MG PO TABS
5.0000 mg | ORAL_TABLET | Freq: Every evening | ORAL | Status: DC | PRN
  Administered 2014-03-01: 5 mg via ORAL
  Filled 2014-03-01: qty 1

## 2014-03-01 MED ORDER — LACTATED RINGERS IV SOLN
INTRAVENOUS | Status: DC
  Administered 2014-03-01: 05:00:00 via INTRAVENOUS
  Administered 2014-03-01: 125 mL/h via INTRAVENOUS

## 2014-03-01 MED ORDER — OXYCODONE-ACETAMINOPHEN 5-325 MG PO TABS: 1.0000 | ORAL_TABLET | ORAL | Status: DC | PRN

## 2014-03-01 MED ORDER — ONDANSETRON HCL 4 MG/2ML IJ SOLN
4.0000 mg | Freq: Four times a day (QID) | INTRAMUSCULAR | Status: DC | PRN
  Administered 2014-03-02: 4 mg via INTRAVENOUS
  Filled 2014-03-01: qty 2

## 2014-03-01 MED ORDER — LACTATED RINGERS IV SOLN
500.0000 mL | Freq: Once | INTRAVENOUS | Status: AC
  Administered 2014-03-01: 500 mL via INTRAVENOUS

## 2014-03-01 MED ORDER — PHENYLEPHRINE 40 MCG/ML (10ML) SYRINGE FOR IV PUSH (FOR BLOOD PRESSURE SUPPORT)
80.0000 ug | PREFILLED_SYRINGE | INTRAVENOUS | Status: DC | PRN
  Filled 2014-03-01: qty 2

## 2014-03-01 MED ORDER — LIDOCAINE HCL (PF) 1 % IJ SOLN
30.0000 mL | INTRAMUSCULAR | Status: DC | PRN
  Filled 2014-03-01: qty 30

## 2014-03-01 MED ORDER — OXYTOCIN 40 UNITS IN LACTATED RINGERS INFUSION - SIMPLE MED
1.0000 m[IU]/min | INTRAVENOUS | Status: DC
  Administered 2014-03-01: 2 m[IU]/min via INTRAVENOUS
  Filled 2014-03-01: qty 1000

## 2014-03-01 MED ORDER — LACTATED RINGERS IV SOLN
500.0000 mL | INTRAVENOUS | Status: DC | PRN
  Administered 2014-03-02: 500 mL via INTRAVENOUS

## 2014-03-01 MED ORDER — OXYTOCIN 40 UNITS IN LACTATED RINGERS INFUSION - SIMPLE MED: 62.5000 mL/h | INTRAVENOUS | Status: DC

## 2014-03-01 MED ORDER — OXYTOCIN BOLUS FROM INFUSION
500.0000 mL | INTRAVENOUS | Status: DC
  Administered 2014-03-01: 500 mL via INTRAVENOUS

## 2014-03-01 MED ORDER — TERBUTALINE SULFATE 1 MG/ML IJ SOLN: 0.2500 mg | Freq: Once | INTRAMUSCULAR | Status: AC | PRN

## 2014-03-01 MED ORDER — OXYCODONE-ACETAMINOPHEN 5-325 MG PO TABS: 2.0000 | ORAL_TABLET | ORAL | Status: DC | PRN

## 2014-03-01 MED ORDER — ACETAMINOPHEN 325 MG PO TABS: 650.0000 mg | ORAL_TABLET | ORAL | Status: DC | PRN

## 2014-03-01 MED ORDER — LIDOCAINE HCL (PF) 1 % IJ SOLN
INTRAMUSCULAR | Status: DC | PRN
  Administered 2014-03-01 (×2): 5 mL

## 2014-03-01 MED ORDER — PHENYLEPHRINE 40 MCG/ML (10ML) SYRINGE FOR IV PUSH (FOR BLOOD PRESSURE SUPPORT)
80.0000 ug | PREFILLED_SYRINGE | INTRAVENOUS | Status: DC | PRN
  Filled 2014-03-01: qty 10
  Filled 2014-03-01: qty 2

## 2014-03-01 MED ORDER — CITRIC ACID-SODIUM CITRATE 334-500 MG/5ML PO SOLN: 30.0000 mL | ORAL | Status: DC | PRN

## 2014-03-01 MED ORDER — FLEET ENEMA 7-19 GM/118ML RE ENEM: 1.0000 | ENEMA | RECTAL | Status: DC | PRN

## 2014-03-01 NOTE — Plan of Care (Signed)
Problem: Phase I Progression Outcomes Goal: Assess per MD/Nurse,Routine-VS,FHR,UC,Head to Toe assess Outcome: Completed/Met Date Met:  03/01/14 Goal: Obtain and review prenatal records Outcome: Completed/Met Date Met:  03/01/14 Goal: Discharge home if all goals are met Outcome: Not Applicable Date Met:  48/18/56 Goal: Medical plan of care initiated within 2 hrs of admission Outcome: Completed/Met Date Met:  03/01/14

## 2014-03-01 NOTE — Anesthesia Preprocedure Evaluation (Signed)
Anesthesia Evaluation  Patient identified by MRN, date of birth, ID band Patient awake    Reviewed: Allergy & Precautions, H&P , Patient's Chart, lab work & pertinent test results  Airway Mallampati: II TM Distance: >3 FB Neck ROM: full    Dental   Pulmonary  breath sounds clear to auscultation        Cardiovascular Rhythm:regular Rate:Normal     Neuro/Psych    GI/Hepatic GERD-  ,  Endo/Other    Renal/GU      Musculoskeletal   Abdominal   Peds  Hematology   Anesthesia Other Findings   Reproductive/Obstetrics (+) Pregnancy                           Anesthesia Physical Anesthesia Plan  ASA: II  Anesthesia Plan: Epidural   Post-op Pain Management:    Induction:   Airway Management Planned:   Additional Equipment:   Intra-op Plan:   Post-operative Plan:   Informed Consent: I have reviewed the patients History and Physical, chart, labs and discussed the procedure including the risks, benefits and alternatives for the proposed anesthesia with the patient or authorized representative who has indicated his/her understanding and acceptance.     Plan Discussed with:   Anesthesia Plan Comments:         Anesthesia Quick Evaluation  

## 2014-03-01 NOTE — Plan of Care (Signed)
Problem: Phase I Progression Outcomes Goal: OOB as tolerated unless otherwise ordered Outcome: Completed/Met Date Met:  03/01/14 Goal: Tolerating diet Outcome: Completed/Met Date Met:  03/01/14 Goal: Adequate progression of labor Outcome: Progressing Goal: Medications/IV Fluids N/A Outcome: Progressing Goal: Induction meds as ordered Outcome: Completed/Met Date Met:  03/01/14 Patient has a cervical  foley bulb in place

## 2014-03-01 NOTE — [facility] Note (Signed)
PT  SAYS SAT 0100- SHE HAD TRICKLE  OF FLUID-  THEN AT 0300   SHE HAD GUSH OF FLUID. .  WAS IN CLINIC   YESTERDAY-  1 CM. GBS--  NEG.

## 2014-03-01 NOTE — Plan of Care (Signed)
Problem: Phase I Progression Outcomes Goal: Pain controlled with appropriate interventions Outcome: Completed/Met Date Met:  03/01/14 Goal: Adequate progression of labor Outcome: Progressing Goal: Medications/IV Fluids N/A Outcome: Completed/Met Date Met:  03/01/14 Goal: IV Pain medications as ordered Outcome: Not Applicable Date Met:  20/10/07 Goal: Pitocin as ordered Outcome: Completed/Met Date Met:  03/01/14 Goal: FHR checked 5 minutes after meds (ROM) Rupture of Membranes Outcome: Not Applicable Date Met:  04/01/74 Goal: Assess/evaluate labor progress and adequacy Outcome: Completed/Met Date Met:  03/01/14 Goal: Assess/evaluate cervical exam prn (q2hrs in active phase) Outcome: Completed/Met Date Met:  03/01/14

## 2014-03-01 NOTE — Progress Notes (Signed)
Rebekah ChromanDeemah Morse is a 23 y.o. G1P0000 at 8440w1d admitted for SROM @0100 .  Subjective: Comfortable with epidural.  Objective: BP 100/64 mmHg  Pulse 93  Temp(Src) 98.6 F (37 C) (Oral)  Resp 20  Ht 5\' 1"  (1.549 m)  Wt 69.174 kg (152 lb 8 oz)  BMI 28.83 kg/m2  SpO2 98%  LMP 04/30/2013      FHT:  FHR: 140 bpm, variability: moderate,  accelerations:  Present,  decelerations:  Absent UC:   regular, every 2-4 minutes SVE:   Dilation: 6 Effacement (%): 70 Station: -2 Exam by:: Rebekah Morse CNM  Labs: Lab Results  Component Value Date   WBC 10.0 03/01/2014   HGB 10.9* 03/01/2014   HCT 34.1* 03/01/2014   MCV 74.9* 03/01/2014   PLT 180 03/01/2014    Assessment / Plan:  Labor: Progressing slowly. Will start Pitocin 2x2 Fetal Wellbeing:  Category I Pain Control:  Epidural I/D:  n/a Anticipated MOD:  NSVD  Shirlee LatchBacigalupo, Angela 03/01/2014, 6:56 PM  Evaluation and management procedures were performed by Resident physician under my supervision/collaboration. Chart reviewed, patient examined by me and I agree with management and plan. Danae Orleanseirdre C Rosibel Giacobbe, CNM 03/01/2014 7:06 PM ]

## 2014-03-01 NOTE — Plan of Care (Signed)
Problem: Phase II Progression Outcomes Goal: Fetal monitoring per orders Outcome: Completed/Met Date Met:  03/01/14 Goal: Frequent position change(s) Outcome: Completed/Met Date Met:  03/01/14 Goal: Empty bladder prn Outcome: Completed/Met Date Met:  03/01/14

## 2014-03-01 NOTE — H&P (Signed)
Rebekah ChromanDeemah Brandenburger is a 23 y.o. female presenting for SROM.  Wants to breast and bottle feed.  Declines birth control.  Ruptured at 0100 clear and blood tinged.  Maternal Medical History:  Reason for admission: Rupture of membranes.     OB History    Gravida Para Term Preterm AB TAB SAB Ectopic Multiple Living   1 0 0 0 0 0 0 0 0 0      History reviewed. No pertinent past medical history. Past Surgical History  Procedure Laterality Date  . No past surgeries     Family History: family history is not on file. Social History:  reports that she has never smoked. She has never used smokeless tobacco. She reports that she does not drink alcohol or use illicit drugs.   Prenatal Transfer Tool  Maternal Diabetes: No Genetic Screening: Declined Maternal Ultrasounds/Referrals: Normal Fetal Ultrasounds or other Referrals:  None Maternal Substance Abuse:  No Significant Maternal Medications:  None Significant Maternal Lab Results:  Lab values include: Group B Strep negative Other Comments:  None   Clinic  Low Risk  Dating 16 week  sono  Genetic Screen Declined  Anatomic US  Normal female  GTT Early: n/a (103 lbs)         Third trimester: 109  TDaP vaccine  11/30/13  Flu vaccine   12/28/13  GBS Negative  Contraception   Considering Mirena  Baby Food  Breast  Circumcision   No (later)  Pediatrician  Undecided  Support Person  FOB     ROS    Blood pressure 122/72, pulse 70, temperature 98.6 F (37 C), temperature source Oral, resp. rate 20, height 5\' 1"  (1.549 m), weight 69.174 kg (152 lb 8 oz), last menstrual period 04/30/2013. Exam Physical Exam  Prenatal labs: ABO, Rh: A/POS/-- (03/24 [id number]) Antibody: NEG (03/24 0937) Rubella: 7.45 (03/24 0937) RPR: NON REAC (08/19 1414)  HBsAg: NEGATIVE (03/24 [id number])  HIV: NONREACTIVE (08/19 1414)  GBS: Negative (10/14 0000)   Assessment/Plan: Rebekah Morse is a 23 y.o. G1P0000 at 4328w1d who SROM - labor: FB placed by me at 0520, will give  cytotec if needed - ID: GBS neg - MOC: declines - MOF: br and bo - circ: at one year of life  Jacquelin Hawkingettey, Ralph 03/01/2014, 4:15 AM

## 2014-03-01 NOTE — Anesthesia Procedure Notes (Signed)
Epidural Patient location during procedure: OB Start time: 03/01/2014 3:32 PM  Staffing Anesthesiologist: Brayton CavesJACKSON, Aaira Oestreicher Performed by: anesthesiologist   Preanesthetic Checklist Completed: patient identified, site marked, surgical consent, pre-op evaluation, timeout performed, IV checked, risks and benefits discussed and monitors and equipment checked  Epidural Patient position: sitting Prep: site prepped and draped and DuraPrep Patient monitoring: continuous pulse ox and blood pressure Approach: midline Location: L3-L4 Injection technique: LOR air  Needle:  Needle type: Tuohy  Needle gauge: 17 G Needle length: 9 cm and 9 Needle insertion depth: 5 cm cm Catheter type: closed end flexible Catheter size: 19 Gauge Catheter at skin depth: 10 cm Test dose: negative  Assessment Events: blood not aspirated, injection not painful, no injection resistance (.epi), negative IV test and no paresthesia  Additional Notes Patient identified.  Risk benefits discussed including failed block, incomplete pain control, headache, nerve damage, paralysis, blood pressure changes, nausea, vomiting, reactions to medication both toxic or allergic, and postpartum back pain.  Patient expressed understanding and wished to proceed.  All questions were answered.  Sterile technique used throughout procedure and epidural site dressed with sterile barrier dressing. No paresthesia or other complications noted.The patient did not experience any signs of intravascular injection such as tinnitus or metallic taste in mouth nor signs of intrathecal spread such as rapid motor block. Please see nursing notes for vital signs.

## 2014-03-02 ENCOUNTER — Encounter (HOSPITAL_COMMUNITY): Payer: Self-pay | Admitting: *Deleted

## 2014-03-02 DIAGNOSIS — Z3A40 40 weeks gestation of pregnancy: Secondary | ICD-10-CM

## 2014-03-02 LAB — CBC
HCT: 25.6 % — ABNORMAL LOW (ref 36.0–46.0)
Hemoglobin: 8.1 g/dL — ABNORMAL LOW (ref 12.0–15.0)
MCH: 23.8 pg — ABNORMAL LOW (ref 26.0–34.0)
MCHC: 31.6 g/dL (ref 30.0–36.0)
MCV: 75.3 fL — AB (ref 78.0–100.0)
Platelets: 139 10*3/uL — ABNORMAL LOW (ref 150–400)
RBC: 3.4 MIL/uL — AB (ref 3.87–5.11)
RDW: 15.9 % — ABNORMAL HIGH (ref 11.5–15.5)
WBC: 14.2 10*3/uL — AB (ref 4.0–10.5)

## 2014-03-02 MED ORDER — ONDANSETRON HCL 4 MG PO TABS: 4.0000 mg | ORAL_TABLET | ORAL | Status: DC | PRN

## 2014-03-02 MED ORDER — FERROUS SULFATE 325 (65 FE) MG PO TABS
325.0000 mg | ORAL_TABLET | Freq: Two times a day (BID) | ORAL | Status: DC
  Administered 2014-03-02 – 2014-03-03 (×2): 325 mg via ORAL
  Filled 2014-03-02 (×2): qty 1

## 2014-03-02 MED ORDER — LANOLIN HYDROUS EX OINT: TOPICAL_OINTMENT | CUTANEOUS | Status: DC | PRN

## 2014-03-02 MED ORDER — OXYCODONE-ACETAMINOPHEN 5-325 MG PO TABS
1.0000 | ORAL_TABLET | ORAL | Status: DC | PRN
  Administered 2014-03-02: 1 via ORAL
  Filled 2014-03-02 (×2): qty 1

## 2014-03-02 MED ORDER — TETANUS-DIPHTH-ACELL PERTUSSIS 5-2.5-18.5 LF-MCG/0.5 IM SUSP: 0.5000 mL | Freq: Once | INTRAMUSCULAR | Status: DC

## 2014-03-02 MED ORDER — SENNOSIDES-DOCUSATE SODIUM 8.6-50 MG PO TABS
2.0000 | ORAL_TABLET | ORAL | Status: DC
  Administered 2014-03-02: 2 via ORAL
  Filled 2014-03-02: qty 2

## 2014-03-02 MED ORDER — ZOLPIDEM TARTRATE 5 MG PO TABS: 5.0000 mg | ORAL_TABLET | Freq: Every evening | ORAL | Status: DC | PRN

## 2014-03-02 MED ORDER — PRENATAL MULTIVITAMIN CH
1.0000 | ORAL_TABLET | Freq: Every day | ORAL | Status: DC
  Administered 2014-03-02 – 2014-03-03 (×2): 1 via ORAL
  Filled 2014-03-02 (×2): qty 1

## 2014-03-02 MED ORDER — SIMETHICONE 80 MG PO CHEW: 80.0000 mg | CHEWABLE_TABLET | ORAL | Status: DC | PRN

## 2014-03-02 MED ORDER — OXYCODONE-ACETAMINOPHEN 5-325 MG PO TABS: 2.0000 | ORAL_TABLET | ORAL | Status: DC | PRN

## 2014-03-02 MED ORDER — DIBUCAINE 1 % RE OINT: 1.0000 "application " | TOPICAL_OINTMENT | RECTAL | Status: DC | PRN

## 2014-03-02 MED ORDER — DIPHENHYDRAMINE HCL 25 MG PO CAPS: 25.0000 mg | ORAL_CAPSULE | Freq: Four times a day (QID) | ORAL | Status: DC | PRN

## 2014-03-02 MED ORDER — BENZOCAINE-MENTHOL 20-0.5 % EX AERO: 1.0000 "application " | INHALATION_SPRAY | CUTANEOUS | Status: DC | PRN

## 2014-03-02 MED ORDER — WITCH HAZEL-GLYCERIN EX PADS: 1.0000 "application " | MEDICATED_PAD | CUTANEOUS | Status: DC | PRN

## 2014-03-02 MED ORDER — IBUPROFEN 600 MG PO TABS
600.0000 mg | ORAL_TABLET | Freq: Four times a day (QID) | ORAL | Status: DC
  Administered 2014-03-02 – 2014-03-03 (×7): 600 mg via ORAL
  Filled 2014-03-02 (×7): qty 1

## 2014-03-02 MED ORDER — ONDANSETRON HCL 4 MG/2ML IJ SOLN: 4.0000 mg | INTRAMUSCULAR | Status: DC | PRN

## 2014-03-02 NOTE — Anesthesia Postprocedure Evaluation (Signed)
  Anesthesia Post-op Note  Patient: Rebekah Morse  Procedure(s) Performed: * No procedures listed *  Patient Location: Mother/Baby  Anesthesia Type:Epidural  Level of Consciousness: awake, alert , oriented and patient cooperative  Airway and Oxygen Therapy: Patient Spontanous Breathing  Post-op Pain: mild  Post-op Assessment: Patient's Cardiovascular Status Stable, Respiratory Function Stable, No headache, No backache, No residual numbness and No residual motor weakness  Post-op Vital Signs: stable  Last Vitals:  Filed Vitals:   03/02/14 0415  BP: 102/48  Pulse: 98  Temp: 37.2 C  Resp: 18    Complications: No apparent anesthesia complications

## 2014-03-02 NOTE — Plan of Care (Signed)
Problem: Phase I Progression Outcomes Goal: Voiding adequately Outcome: Completed/Met Date Met:  03/02/14 Goal: Foley catheter patent Outcome: Not Applicable Date Met:  61/68/37 Goal: OOB as tolerated unless otherwise ordered Outcome: Completed/Met Date Met:  03/02/14 Goal: IS, TCDB as ordered Outcome: Not Applicable Date Met:  29/02/11 Goal: Other Phase I Outcomes/Goals Outcome: Not Applicable Date Met:  15/52/08

## 2014-03-02 NOTE — Plan of Care (Signed)
Problem: Phase II Progression Outcomes Goal: Tolerating diet Outcome: Completed/Met Date Met:  03/02/14 Goal: Other Phase II Outcomes/Goals Outcome: Not Applicable Date Met:  74/82/70

## 2014-03-02 NOTE — Plan of Care (Signed)
Problem: Phase II Progression Outcomes Goal: Rh isoimmunization per orders Outcome: Not Applicable Date Met:  34/03/52

## 2014-03-02 NOTE — Lactation Note (Signed)
This note was copied from the chart of Rebekah Jenya Holdman. Lactation Consultation Note  P1, Mother speaks and understand some English but FOB interpreted. Reviewed hand express.  Drops of colostrum of expressed. Discusssed breastfeeding basics, feeding cues, cluster feeding. Mom encouraged to feed baby 8-12 times/24 hours and with feeding cues.  Mom made aware of O/P services, breastfeeding support groups, community resources, and our phone # for post-discharge questions.  Mother had lots of questions regarding how to know baby is getting enough.  Reviewed monitoring voids/stools. Explained to parents supply and demand and size of baby's stomach.  Patient Name: Rebekah Morse ZOXWR'UToday's Date: 03/02/2014 ReasLoman Chromanon for consult: Initial assessment   Maternal Data Has patient been taught Hand Expression?: Yes Does the patient have breastfeeding experience prior to this delivery?: No  Feeding Feeding Type: Breast Fed Length of feed: 30 min  LATCH Score/Interventions                      Lactation Tools Discussed/Used     Consult Status Consult Status: Follow-up Date: 03/03/14 Follow-up type: In-patient    Dahlia ByesBerkelhammer, Ruth [facility] PB[facility] 03/02/2014, 12:53 PM

## 2014-03-02 NOTE — Progress Notes (Signed)
RN used stedy to move patient to wheelchair in order to transport patient to [facility].  Patient passed out while on stedy.  Patient placed back in bed and bolus of LR given due to hypotension.

## 2014-03-02 NOTE — Progress Notes (Signed)
Post Partum Day 1 Subjective: voiding and tolerating PO, complains of dizziness upon standing  Objective: Blood pressure 102/48, pulse 98, temperature 98.9 F (37.2 C), temperature source Oral, resp. rate 18, height 5\' 1"  (1.549 m), weight 69.174 kg (152 lb 8 oz), last menstrual period 04/30/2013, SpO2 98 %, unknown if currently breastfeeding.  Physical Exam:  General: alert, cooperative, no distress and pale Lochia: appropriate Uterine Fundus: firm DVT Evaluation: No evidence of DVT seen on physical exam.   Recent Labs  03/01/14 0510 03/02/14 0630  HGB 10.9* 8.1*  HCT 34.1* 25.6*    Assessment/Plan: Plan for discharge tomorrow and Breastfeeding  -Consider iron for anemia - consider orthostatics for dizziness   LOS: 1 day   Shirlee LatchBacigalupo, Rebekah Morse 03/02/2014, 7:50 AM

## 2014-03-02 NOTE — Plan of Care (Signed)
Problem: Phase II Progression Outcomes Goal: Pain controlled on oral analgesia Outcome: Completed/Met Date Met:  03/02/14 Goal: Progress activity as tolerated unless otherwise ordered Outcome: Completed/Met Date Met:  03/02/14 Goal: Afebrile, VS remain stable Outcome: Completed/Met Date Met:  03/02/14 Goal: Incision intact & without signs/symptoms of infection Outcome: Completed/Met Date Met:  03/02/14     

## 2014-03-02 NOTE — Plan of Care (Signed)
Problem: Phase I Progression Outcomes Goal: Pain controlled with appropriate interventions Outcome: Completed/Met Date Met:  03/02/14 Goal: VS, stable, temp < 100.4 degrees F Outcome: Completed/Met Date Met:  03/02/14 Goal: Initial discharge plan identified Outcome: Completed/Met Date Met:  03/02/14

## 2014-03-03 MED ORDER — IBUPROFEN 600 MG PO TABS: 600.0000 mg | ORAL_TABLET | ORAL | Status: AC | PRN

## 2014-03-03 MED ORDER — SENNOSIDES-DOCUSATE SODIUM 8.6-50 MG PO TABS
2.0000 | ORAL_TABLET | ORAL | Status: AC
Start: 2014-03-03 — End: ?

## 2014-03-03 MED ORDER — FERROUS SULFATE 325 (65 FE) MG PO TABS: 325.0000 mg | ORAL_TABLET | Freq: Two times a day (BID) | ORAL | Status: AC

## 2014-03-03 MED ORDER — LANOLIN HYDROUS EX OINT
1.0000 | TOPICAL_OINTMENT | CUTANEOUS | Status: AC | PRN
Start: 2014-03-03 — End: ?

## 2014-03-03 NOTE — Lactation Note (Addendum)
This note was copied from the chart of Rebekah Morse. Lactation Consultation Note  Patient Name: Rebekah Morse: 03/03/2014 Reason for consult: Follow-up assessment  Baby is 833 1/2 hours old , baby awake and hungry, LC reviewed basics , breast massage, hand express, prepump  To make the nipple and areola more elastic and latch with breast compressions and firm support.  Baby latched in the right in cross cradle with LC assist to ease down chin and comfort achieved.  Baby latched on the left breast and LC assisted  and showed dad how to ease down on chin and comfort achieved. Baby fed 8 mins on both breast , multiply swallows noted, increased with breast compressions.  LC reviewed basics , steps for latching and sore nipple and engorgement prevention and tx , referring to the Baby and me  Booklet. Extra feeding diary sheets given with instruction . Also showed mom and dad how wet a diaper should be when the  Milk comes in with water in a pamper . Mom already has comfort gels , LC reviewed usage , shells , hand pump , #24 flange a good fit.  Mother informed of post-discharge support and given phone number to the lactation department, including services for phone call assistance; out-patient appointments; and breastfeeding support group. List of other breastfeeding resources in the community given in the handout. Encouraged mother to call for problems or concerns related to breastfeeding.  LC noted  Mom to have very small breast , mom does report breast changes with pregnancy. , nipples are tender per mom , pinky red . Hand expressing reviewed , and mom repeated demo.    Maternal Data Has patient been taught Hand Expression?: Yes  Feeding Feeding Type: Breast Fed Length of feed: 8 min  LATCH Score/Interventions Latch: Grasps breast easily, tongue down, lips flanged, rhythmical sucking.  Audible Swallowing: Spontaneous and intermittent  Type of Nipple: Everted at rest  and after stimulation  Comfort (Breast/Nipple): Soft / non-tender  Problem noted: Mild/Moderate discomfort  Hold (Positioning): Assistance needed to correctly position infant at breast and maintain latch. Intervention(s): Breastfeeding basics reviewed;Support Pillows;Position options;Skin to skin  LATCH Score: 9  Lactation Tools Discussed/Used Tools: Shells;Pump Shell Type: Inverted Breast pump type: Manual Pump Review: Setup, frequency, and cleaning Initiated by:: MAI  Morse initiated:: 03/03/14   Consult Status Consult Status: Complete Morse: 03/03/14 Follow-up type: In-patient    Kathrin Greathouseorio, Gionni Vaca Ann 03/03/2014, 10:32 AM

## 2014-03-03 NOTE — Discharge Instructions (Signed)

## 2014-03-03 NOTE — Discharge Summary (Signed)
Obstetric Discharge Summary Reason for Admission: rupture of membranes Prenatal Procedures: none Intrapartum Procedures: spontaneous vaginal delivery Postpartum Procedures: none Complications-Operative and Postpartum: 2nd degree perineal laceration HEMOGLOBIN  Date Value Ref Range Status  03/02/2014 8.1* 12.0 - 15.0 g/dL Final    Comment:    REPEATED TO VERIFY DELTA CHECK NOTED    HCT  Date Value Ref Range Status  03/02/2014 25.6* 36.0 - 46.0 % Final   Hospital Course: Rebekah Morse is a 23 y.o. G1P1001 who presented with SROM.  She progressed well with labor after FB placed and pitocin started. At 2336, patient delivered female infant via NSVD.  On PPD#1, patient reporting dizziness and noted to be pale.  Started on iron, and feelign better on PPD #2.  Physical Exam:  General: alert, cooperative and no distress Lochia: appropriate Uterine Fundus: firm DVT Evaluation: No evidence of DVT seen on physical exam.  Discharge Diagnoses: Term Pregnancy-delivered  Discharge Information: Date: 03/03/2014 Activity: pelvic rest Diet: routine Medications: Ibuprofen, Colace and Iron Condition: stable Instructions: refer to practice specific booklet Discharge to: home Follow-up Information    Follow up with [facility]. Schedule an appointment as soon as possible for a visit in 6 weeks.   Why:  For post-partum visit   Contact information:   [address] [place] [id number]       [facility]   Why:  As needed for emergencies   Contact information:   [address] [id number] [place] [id number] [phone]      Newborn Data: Live born female  Birth Weight: 8 lb 11.2 oz (3945 g) APGAR: 9, 9  Home with mother.  Rebekah Morse, Rebekah 03/03/2014, 7:53 AM   I have seen and examined this patient and agree the above assessment. Rebekah Morse 03/07/2014 12:21 PM

## 2014-03-26 ENCOUNTER — Emergency Department (HOSPITAL_COMMUNITY): Payer: PPO

## 2014-03-26 ENCOUNTER — Encounter (HOSPITAL_COMMUNITY): Payer: Self-pay | Admitting: Emergency Medicine

## 2014-03-26 ENCOUNTER — Emergency Department (HOSPITAL_COMMUNITY)
Admission: EM | Admit: 2014-03-26 | Discharge: 2014-03-27 | Disposition: A | Discharge status: Home / Self Care | Payer: PPO | Attending: Emergency Medicine | Admitting: Emergency Medicine

## 2014-03-26 DIAGNOSIS — Z79899 Other long term (current) drug therapy: Secondary | ICD-10-CM | POA: Diagnosis not present

## 2014-03-26 DIAGNOSIS — O9963 Diseases of the digestive system complicating the puerperium: Secondary | ICD-10-CM | POA: Diagnosis present

## 2014-03-26 DIAGNOSIS — K5900 Constipation, unspecified: Secondary | ICD-10-CM | POA: Insufficient documentation

## 2014-03-26 LAB — CBC WITH DIFFERENTIAL/PLATELET
Band Neutrophils: 5 % (ref 0–10)
Basophils Absolute: 0.1 10*3/uL (ref 0.0–0.1)
Basophils Relative: 1 % (ref 0–1)
Blasts: 0 %
Eosinophils Absolute: 0.2 10*3/uL (ref 0.0–0.7)
Eosinophils Relative: 3 % (ref 0–5)
HCT: 32.5 % — ABNORMAL LOW (ref 36.0–46.0)
Hemoglobin: 10 g/dL — ABNORMAL LOW (ref 12.0–15.0)
Lymphocytes Relative: 33 % (ref 12–46)
Lymphs Abs: 2.1 10*3/uL (ref 0.7–4.0)
MCH: 22.4 pg — ABNORMAL LOW (ref 26.0–34.0)
MCHC: 30.8 g/dL (ref 30.0–36.0)
MCV: 72.7 fL — ABNORMAL LOW (ref 78.0–100.0)
Metamyelocytes Relative: 0 %
Monocytes Absolute: 0.3 10*3/uL (ref 0.1–1.0)
Monocytes Relative: 5 % (ref 3–12)
Myelocytes: 0 %
Neutro Abs: 3.6 10*3/uL (ref 1.7–7.7)
Neutrophils Relative %: 53 % (ref 43–77)
Platelets: 254 10*3/uL (ref 150–400)
Promyelocytes Absolute: 0 %
RBC: 4.47 MIL/uL (ref 3.87–5.11)
RDW: 15.7 % — ABNORMAL HIGH (ref 11.5–15.5)
WBC: 6.3 10*3/uL (ref 4.0–10.5)
nRBC: 0 /100 WBC

## 2014-03-26 LAB — BASIC METABOLIC PANEL
Anion gap: 16 — ABNORMAL HIGH (ref 5–15)
BUN: 15 mg/dL (ref 6–23)
CO2: 22 mEq/L (ref 19–32)
Calcium: 9 mg/dL (ref 8.4–10.5)
Chloride: 101 mEq/L (ref 96–112)
Creatinine, Ser: 0.51 mg/dL (ref 0.50–1.10)
GFR calc Af Amer: >90 mL/min (ref >90)
GFR calc non Af Amer: >90 mL/min (ref >90)
Glucose, Bld: 94 mg/dL (ref 70–99)
Potassium: 3.5 mEq/L — ABNORMAL LOW (ref 3.7–5.3)
Sodium: 139 mEq/L (ref 137–147)

## 2014-03-26 LAB — POC OCCULT BLOOD, ED: Fecal Occult Bld: POSITIVE — AB

## 2014-03-26 MED ORDER — GLYCERIN (LAXATIVE) 2.1 G RE SUPP
1.0000 | Freq: Once | RECTAL | Status: AC
  Administered 2014-03-26: 1 via RECTAL
  Filled 2014-03-26: qty 1

## 2014-03-26 MED ORDER — SODIUM CHLORIDE 0.9 % IV BOLUS (SEPSIS)
1000.0000 mL | Freq: Once | INTRAVENOUS | Status: AC
  Administered 2014-03-26: 1000 mL via INTRAVENOUS

## 2014-03-26 MED ORDER — MILK AND MOLASSES ENEMA
1.0000 | Freq: Once | RECTAL | Status: AC
  Administered 2014-03-27: 250 mL via RECTAL
  Filled 2014-03-26: qty 250

## 2014-03-26 NOTE — ED Notes (Addendum)
Pt states she delivered a baby 24 days ago and had hemorrhoids and is now having constipation and a small amount bright red blood in stool. Pt states Last BM was today. Pt is unable to sit down without having pain. Pt has been using stool softer and has not had any relief. Pt states BM was solid and hard.

## 2014-03-27 MED ORDER — DOCUSATE SODIUM 100 MG PO CAPS: 100.0000 mg | ORAL_CAPSULE | Freq: Two times a day (BID) | ORAL | Status: AC

## 2014-03-27 NOTE — ED Notes (Signed)
Pt A&OX4, ambulatory at d/c with steady gait, NAD, thanking this RN for her care.

## 2014-03-27 NOTE — ED Notes (Signed)
Pt reporting she feels much better after bowel movement.

## 2014-03-27 NOTE — Discharge Instructions (Signed)
Return here as needed. Follow up with your doctor.  Increase her fluid intake.  He can also take MiraLAX over-the-counter as needed

## 2014-03-27 NOTE — ED Provider Notes (Signed)
CSN: [id number]     Arrival date & time 03/26/14  1848 History   First MD Initiated Contact with Patient 03/26/14 1935     Chief Complaint  Patient presents with  . Constipation     (Consider location/radiation/quality/duration/timing/severity/associated sxs/prior Treatment) HPI Patient presents to the emergency department with the complaint of constipation.  The patient states that she delivered a baby 24 days ago during her pregnancy, developed hemorrhoids and has had trouble with bowel movements since that time.  The patient states that her bowel movements have been hard.  Patient states that she has not had any nausea, vomiting, fever, weakness, dizziness, abdominal pain, headache, blurred vision, back pain, rash, dysuria, chest pain, shortness of breath, diarrhea, near syncope or syncope.  Patient, states she has been on stool softener. Past Medical History  Diagnosis Date  . Medical history non-contributory    Past Surgical History  Procedure Laterality Date  . No past surgeries     No family history on file. History  Substance Use Topics  . Smoking status: Never Smoker   . Smokeless tobacco: Never Used  . Alcohol Use: No   OB History    Gravida Para Term Preterm AB TAB SAB Ectopic Multiple Living   1 1 1  0 0 0 0 0 0 1     Review of Systems   All other systems negative except as documented in the HPI. All pertinent positives and negatives as reviewed in the HPI. Allergies  Review of patient's allergies indicates no known allergies.  Home Medications   Prior to Admission medications   Medication Sig Start Date End Date Taking? Authorizing Provider  ferrous sulfate 325 (65 FE) MG tablet Take 1 tablet (325 mg total) by mouth 2 (two) times daily with a meal. 03/03/14  Yes Shirlee LatchAngela Bacigalupo, MD  ibuprofen (ADVIL,MOTRIN) 600 MG tablet Take 1 tablet (600 mg total) by mouth every 4 (four) hours as needed. 03/03/14  Yes Shirlee LatchAngela Bacigalupo, MD  lanolin OINT Apply 1  application topically as needed (for breast care). 03/03/14  Yes Shirlee LatchAngela Bacigalupo, MD  loratadine (CLARITIN) 10 MG tablet Take 1 tablet (10 mg total) by mouth daily. 01/11/14  Yes Aviva SignsMarie L Williams, CNM  Prenatal Vit-Fe Fumarate-FA (PRENATAL MULTIVITAMIN) TABS tablet Take 1 tablet by mouth daily at 12 noon.   Yes Historical Provider, MD  senna-docusate (SENOKOT-S) 8.6-50 MG per tablet Take 2 tablets by mouth daily. 03/03/14  Yes Shirlee LatchAngela Bacigalupo, MD  promethazine (PHENERGAN) 25 MG tablet Take 1 tablet (25 mg total) by mouth every 6 (six) hours as needed for nausea or vomiting. Patient not taking: Reported on 03/26/2014 10/29/13   Doralee AlbinoLinda M Barefoot, NP   BP 115/67 mmHg  Pulse 72  Temp(Src) 97.9 F (36.6 C) (Oral)  Resp 17  SpO2 100% Physical Exam  Constitutional: She is oriented to person, place, and time. She appears well-developed and well-nourished. No distress.  HENT:  Head: Normocephalic and atraumatic.  Mouth/Throat: Oropharynx is clear and moist.  Eyes: Pupils are equal, round, and reactive to light.  Neck: Normal range of motion. Neck supple.  Cardiovascular: Normal rate, regular rhythm and normal heart sounds.  Exam reveals no gallop and no friction rub.   No murmur heard. Pulmonary/Chest: Effort normal and breath sounds normal. No respiratory distress.  Abdominal: Soft. Bowel sounds are normal. She exhibits no distension. There is no tenderness. There is no rebound.  Neurological: She is alert and oriented to person, place, and time.  Skin: Skin is warm  and dry. No rash noted. She is not diaphoretic.  Psychiatric: She has a normal mood and affect. Her behavior is normal.  Nursing note and vitals reviewed.   ED Course  Procedures (including critical care time) Labs Review Labs Reviewed  BASIC METABOLIC PANEL - Abnormal; Notable for the following:    Potassium 3.5 (*)    Anion gap 16 (*)    All other components within normal limits  CBC WITH DIFFERENTIAL - Abnormal; Notable  for the following:    Hemoglobin 10.0 (*)    HCT 32.5 (*)    MCV 72.7 (*)    MCH 22.4 (*)    RDW 15.7 (*)    All other components within normal limits  POC OCCULT BLOOD, ED - Abnormal; Notable for the following:    Fecal Occult Bld POSITIVE (*)    All other components within normal limits    Imaging Review Dg Abd Acute W/chest  03/26/2014   CLINICAL DATA:  Constipation for 2 days, 20 days postpartum.  EXAM: ACUTE ABDOMEN SERIES (ABDOMEN 2 VIEW & CHEST 1 VIEW)  COMPARISON:  None.  FINDINGS: Cardiomediastinal silhouette is unremarkable. Lungs are clear, no pleural effusions. No pneumothorax. Soft tissue planes and included osseous structures are unremarkable.  Bowel gas pattern is nondilated and nonobstructive. Moderate amount of retained large bowel stool. No intra-abdominal mass effect, pathologic calcifications or free air. Soft tissue planes and included osseous structures are nonsuspicious.  IMPRESSION: No acute cardiopulmonary process.  Moderate amount of retained large bowel stool without obstructive bowel gas pattern.   Electronically Signed   By: Awilda Metroourtnay  Bloomer   On: 03/26/2014 22:59   Patient is given a milk and molasses enema, along with a glycerin suppository and IV fluids.  She will be sent home on MiraLAX stool softener and suppositories.  Patient is advised follow-up with her primary care Dr. told to return here as needed.  I believe her bleeding is from the hemorrhoids and there is no active bleeding at this time  MDM   Final diagnoses:  None      Carlyle DollyChristopher W Teresea Donley, PA-C 03/27/14 [id number]  Elwin MochaBlair Walden, MD 03/27/14 [phone]

## 2014-04-19 ENCOUNTER — Ambulatory Visit (INDEPENDENT_AMBULATORY_CARE_PROVIDER_SITE_OTHER): Payer: PPO | Admitting: Obstetrics & Gynecology

## 2014-04-19 ENCOUNTER — Encounter: Payer: Self-pay | Admitting: Obstetrics & Gynecology

## 2014-04-19 NOTE — Progress Notes (Signed)
Patient ID: Rebekah Morse, female   DOB: 1990/07/02, 24 y.o.   MRN: [id number] Subjective:     Rebekah Morse is a 24 y.o. female who presents for a postpartum visit. She is 6 weeks postpartum following a spontaneous vaginal delivery. I have fully reviewed the prenatal and intrapartum course. The delivery was at 40 gestational weeks. Outcome: spontaneous vaginal delivery. Anesthesia: epidural. Postpartum course has been uncomlicated. Baby's course has been unremarkable. Baby is feeding by breast. Bleeding no bleeding. Bowel function is normal. Bladder function is normal. Patient is not sexually active. Contraception method is abstinence. Postpartum depression screening: negative.  The following portions of the patient's history were reviewed and updated as appropriate: allergies, current medications, past family history, past medical history, past social history, past surgical history and problem list.  Review of Systems A comprehensive review of systems was negative.   Objective:    BP 117/62 mmHg  Pulse 85  Temp(Src) 98.2 F (36.8 C) (Oral)  Wt 129 lb 8 oz (58.741 kg)  Breastfeeding? Yes        Abd; soft, NT; ND  GU: EGBUS: no lesions; well healed Vagina: no blood in vault     Assessment:     6 weeks postpartum exam. Pap smear not done at today's visit.   D/W PP contraception.  She declines.   Plan:    1. Contraception: natural family planning 2.  Follow up in: 1 year or as needed.

## 2014-04-19 NOTE — Patient Instructions (Signed)
Natural Family Planning Natural Family Planning (NFP) is a type of birth control without using any form of contraception. Women who use NFP should not have sexual intercourse when the ovary produces an egg (ovulation) during the menstrual cycle. The NFP method is safe and can prevent pregnancy. It is 75% effective when practiced right. The man needs to also understand this method of birth control and the woman needs to be aware of how her body functions during her menstrual cycle. NFP can also be used as a method of getting pregnant.  HOW THE NFP METHOD WORKS  A woman's menstrual period usually happens every 28-30 days (it can vary from 23-35 days).  Ovulation happens 12-14 days before the start of the next menstrual period (the fertile period). The egg is fertile for 24 hours and the sperm can live for 3 days or more. If there is sexual intercourse at this time, pregnancy can occur. THERE ARE MANY TYPES OF NFP METHODS USED TO PREVENT PREGNANCY  The basal body temperature method. Often times, there is a slight increase of body temperature when a woman ovulates. Take your temperature every morning before getting out of bed. Write the temperature on a chart. An increase in the temperature shows ovulation has happened. Do not have sexual intercourse from the menstrual period up to three days after the increase in the temperature. Note that the body temperature may increase as a result of fever, restless sleep, and working schedules.  The ovulation cervical mucus method. During the menstrual cycle, the cervical mucus changes from dry and sticky to wet and slippery. Check the mucus of the vagina every day to look for these changes. Just before ovulation, the mucus becomes wet and slippery. On the last day of wetness, ovulation happens. To avoid getting pregnant, sexual intercourse is safe for about 10 days after the menstrual period and on the dry mucus days. Do not have sexual intercourse when the mucus  starts to show up and not until 4 days after the wet and slippery mucus goes away. Sexual intercourse after the 4 days have passed until the menstrual period starts is a safe time. Note that the mucus from the vagina can increase because of a vaginal or cervical infection, lubricants, some medicines, and sexual excitement.  The symptothermal method. This method uses both the temperature and the ovulation methods. Combine the two methods above to prevent pregnancy.  The calendar method. Record your menstrual periods and length of the cycles for 6 months. This is helpful when the menstrual cycle varies in the length of the cycle. The length of a menstrual cycle is from day 1 of the present menstrual period to day 1 of the next menstrual period. Then, find your fertile days of the month and do not have sexual intercourse during that time. You may need help from your health care provider to find out your fertile days. There are some signs of ovulation that may be helpful when trying to find the time of ovulation. This includes vaginal spotting or abdominal cramps during the middle of your menstrual cycle. Not all women have these symptoms. YOU SHOULD NOT USE NFP IF:  You have very irregular menstrual periods and may skip months.  You have abnormal bleeding.  You have a vaginal or cervical infection.  You are on medicines that can affect the vaginal mucus or body temperature. These medicines include antibiotics, thyroid medicines, and antihistamines (cold and allergy medicine). Document Released: 09/17/2007 Document Revised: 04/05/2013 Document Reviewed: 10/01/2012   Exit[employer] Patient Information 2015 [employer]. This information is not intended to replace advice given to you by your health care provider. Make sure you discuss any questions you have with your health care provider.  

## 2015-09-27 IMAGING — US US OB COMP +14 WK
1 series · 12 of 28 positions shown · non-contrast
Comparison: none

[Series 1: us ob comp +14 wk · 94 acquisitions, 12 frames shown]
[im 4/94]
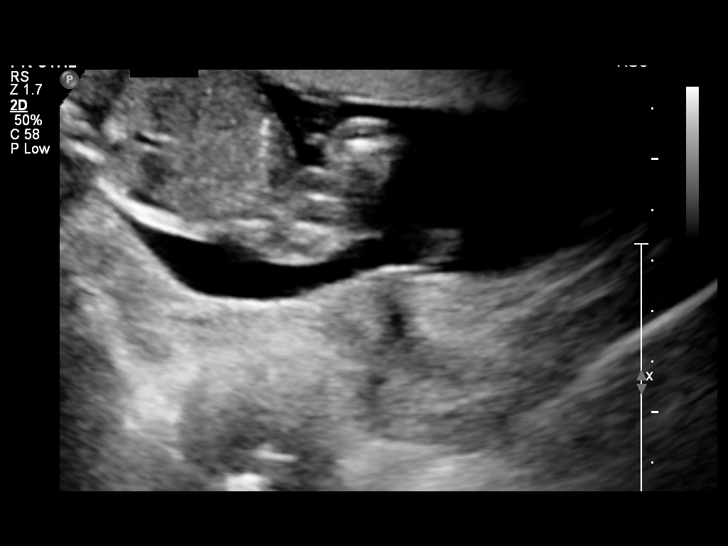
[im 11/94]
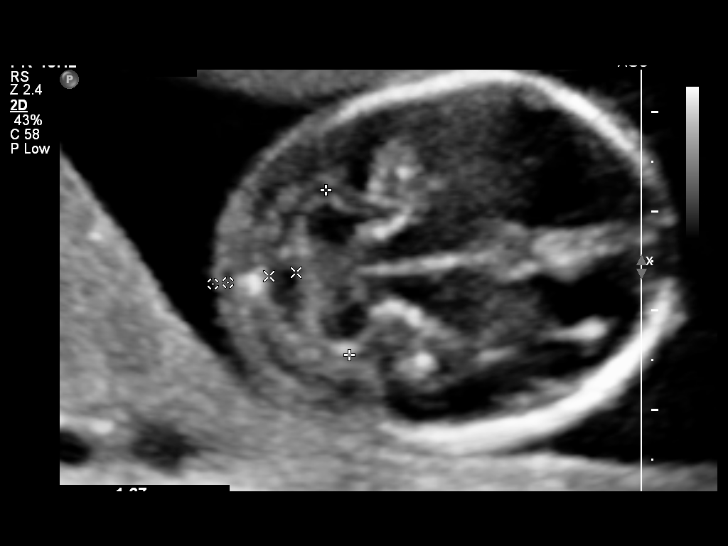
[im 18/94]
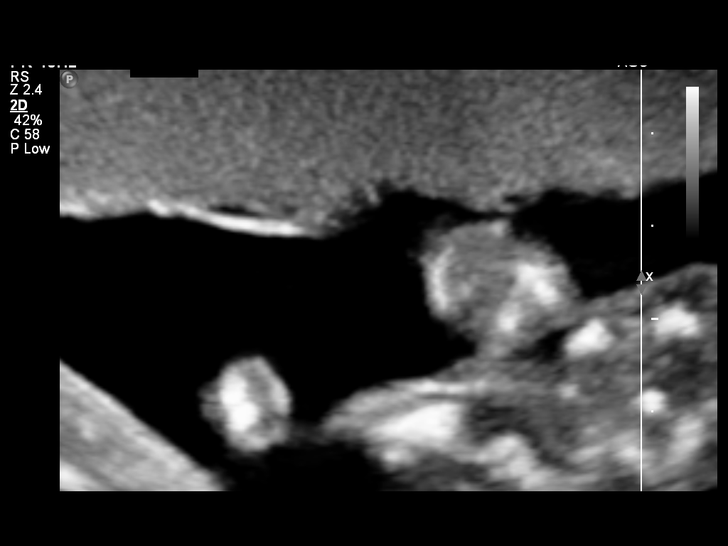
[im 28/94]
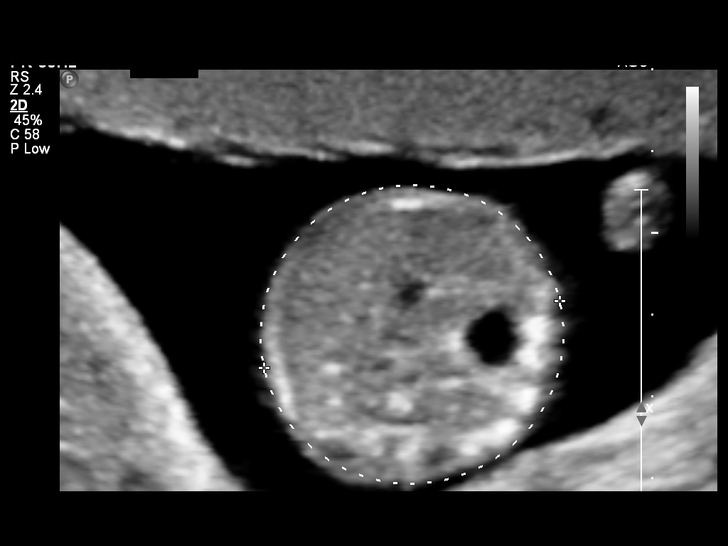
[im 35/94]
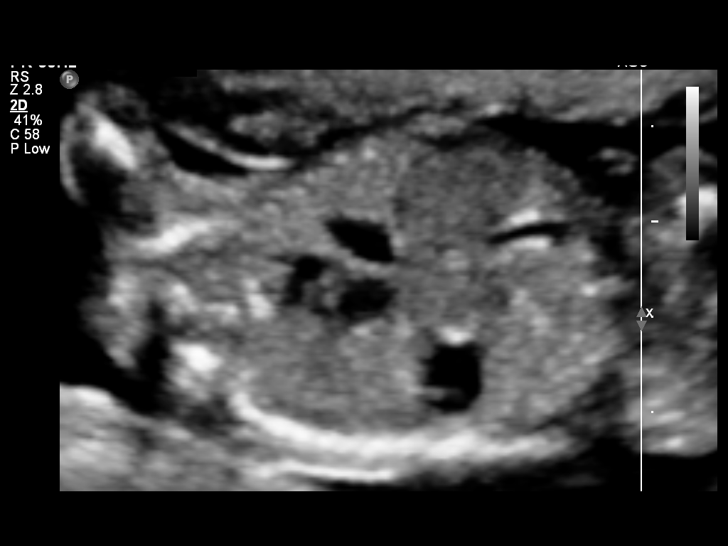
[im 42/94]
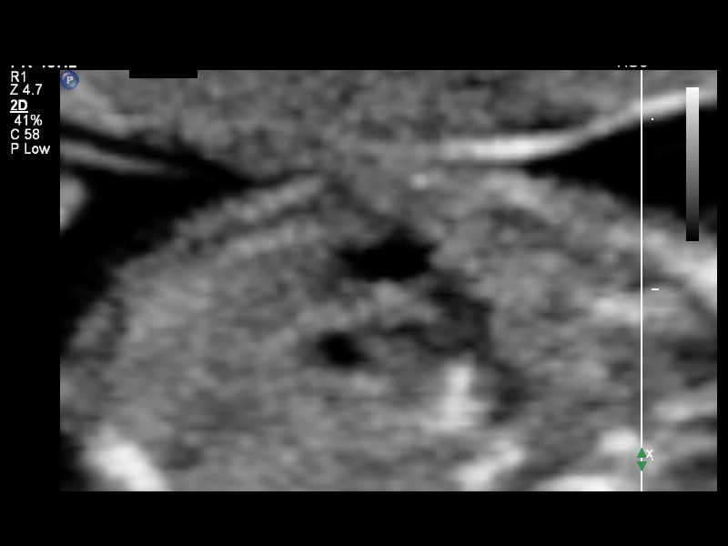
[im 52/94]
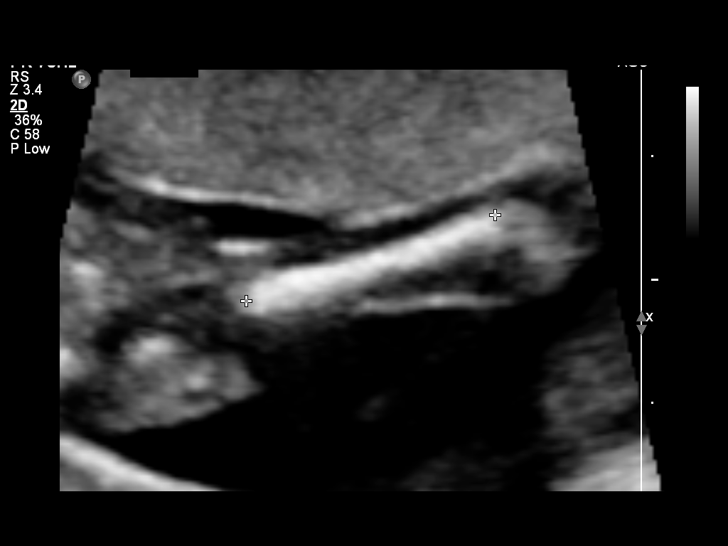
[im 59/94]
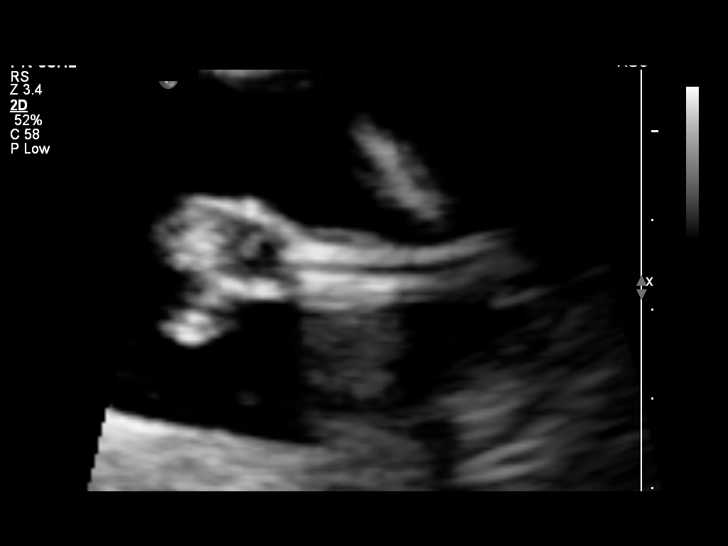
[im 66/94]
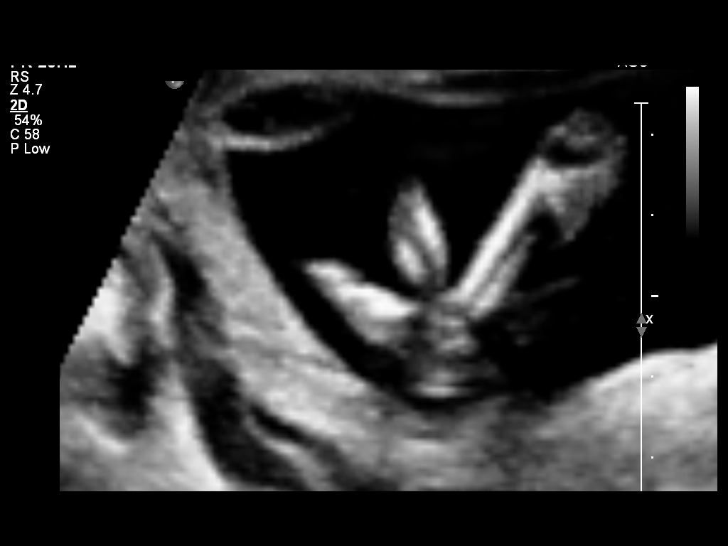
[im 76/94]
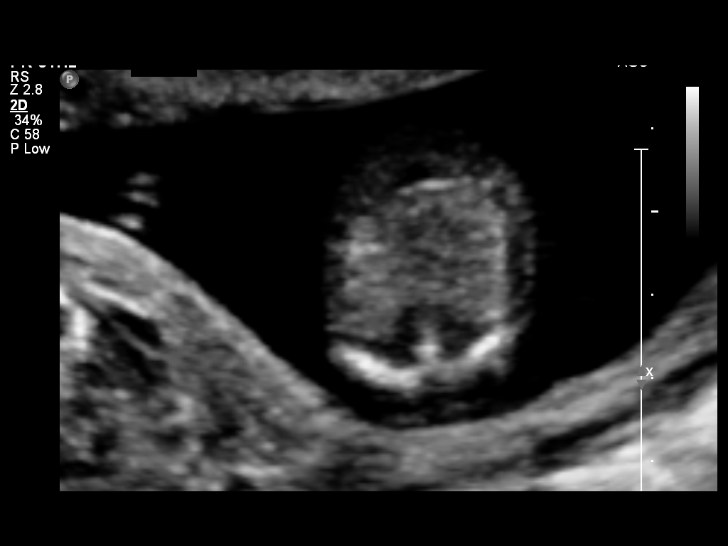
[im 83/94]
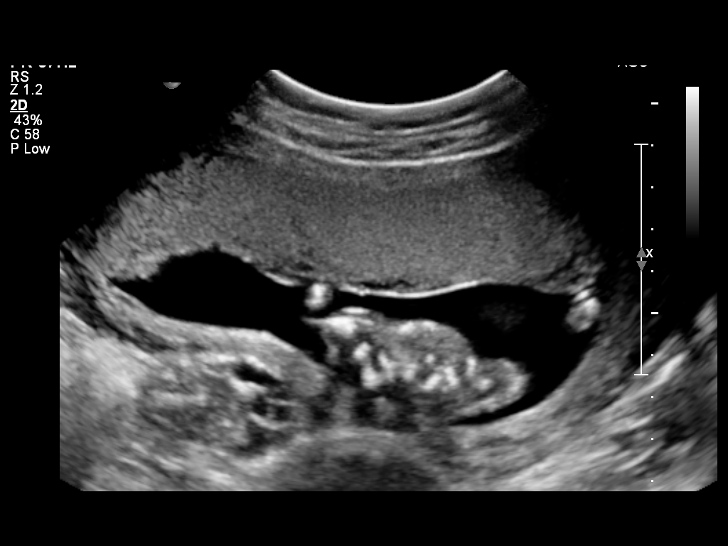
[im 90/94]
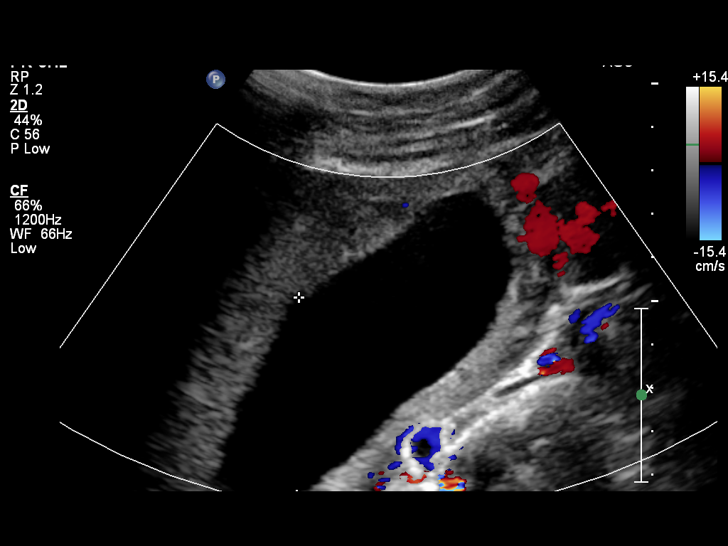

[12 of 28 positions shown; findings below may reference images not displayed]

OBSTETRICS REPORT
                      (Signed Final 09/19/2013 [DATE])

Service(s) Provided

 US OB COMP + 14 WK                                    76805.1
Indications

 Basic anatomic survey
 No or Little Prenatal Care
Fetal Evaluation

 Num Of Fetuses:    1
 Fetal Heart Rate:  147                          bpm
 Cardiac Activity:  Observed
 Presentation:      Breech
 Placenta:          Anterior, above cervical os
 P. Cord            Visualized, central
 Insertion:

 Amniotic Fluid
 AFI FV:      Subjectively within normal limits
                                             Larg Pckt:     4.5  cm
Biometry

 BPD:     34.8  mm     G. Age:  16w 5d                CI:        74.27   70 - 86
                                                      FL/HC:      17.9   14.6 -

 HC:     128.2  mm     G. Age:  16w 4d       22  %    HC/AC:      1.11   1.07 -

 AC:     115.8  mm     G. Age:  17w 3d       66  %    FL/BPD:
 FL:      22.9  mm     G. Age:  16w 6d       46  %    FL/AC:      19.8   20 - 24
 HUM:     21.8  mm     G. Age:  16w 4d       51  %
 CER:     16.7  mm     G. Age:  16w 5d       46  %
 NFT:      1.5  mm
 Est. FW:     179  gm      0 lb 6 oz     61  %
Gestational Age

 LMP:           20w 2d        Date:  04/30/13                 EDD:   02/04/14
 U/S Today:     16w 6d                                        EDD:   02/28/14
 Best:          16w 6d     Det. By:  U/S (09/19/13)           EDD:   02/28/14
Anatomy
 Cranium:          Appears normal         Aortic Arch:      Not well visualized
 Fetal Cavum:      Appears normal         Ductal Arch:      Not well visualized
 Ventricles:       Not well visualized    Diaphragm:        Not well visualized
 Choroid Plexus:   Appears normal         Stomach:          Appears normal, left
                                                            sided
 Cerebellum:       Appears normal         Abdomen:          Appears normal
 Posterior Fossa:  Appears normal         Abdominal Wall:   Appears nml (cord
                                                            insert, abd wall)
 Nuchal Fold:      Appears normal         Cord Vessels:     Appears normal (3
                                                            vessel cord)
 Face:             Appears normal         Kidneys:          Not well visualized
                   (orbits and profile)
 Lips:             Not well visualized    Bladder:          Appears normal
 Heart:            Not well visualized    Spine:            Not well visualized
 RVOT:             Not well visualized    Lower             Appears normal
                                          Extremities:
 LVOT:             Not well visualized    Upper             Appears normal
                                          Extremities:

 Other:  Fetus appears to be a male. Heels visualized. Technically difficult due
         to early gestational age.
Targeted Anatomy

 Fetal Central Nervous System
 Cisterna Magna:
Cervix Uterus Adnexa

 Cervical Length:    3.6      cm

 Cervix:       Normal appearance by transabdominal scan.
 Uterus:       No abnormality visualized.
 Left Ovary:    Not visualized.
 Right Ovary:   Not visualized.

 Adnexa:     No abnormality visualized. No adnexal mass visualized.
Impression

 SIUP at 18w8d on attempted comprehensive fetal survey
 (remote read)
 EFW 61st%
 No dysmorphic features, noting evaluation limited by early
 gestational age as detailed above
 No previa
Recommendations

 Recommend follow up attempt to formally assess anatomy
 prior to 20 weeks (eg, please shedule in the next 2-3 weeks).

 questions or concerns.

## 2015-10-26 IMAGING — US US OB FOLLOW-UP
1 series · 12 of 28 positions shown · non-contrast
Comparison: none

[Series 1: us ob follow up · 74 acquisitions, 12 frames shown]
[im 3/74]
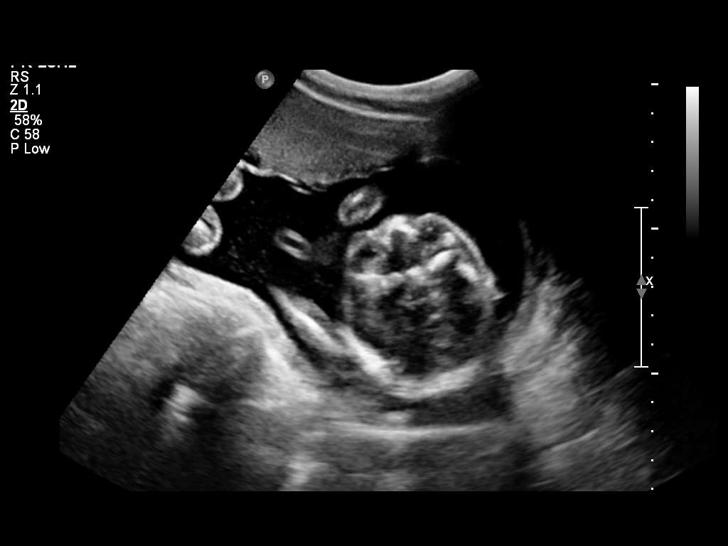
[im 9/74]
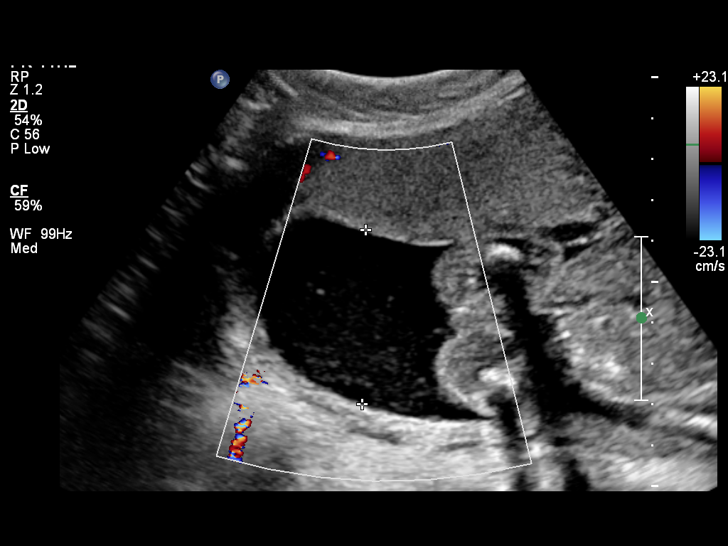
[im 14/74]
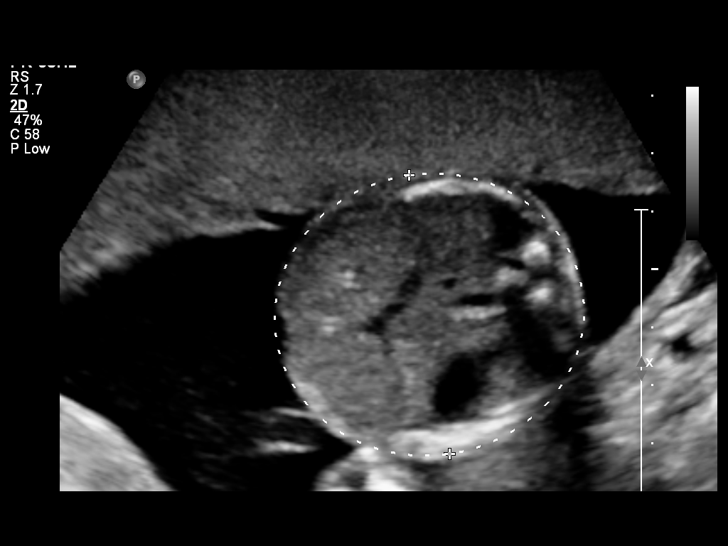
[im 22/74]
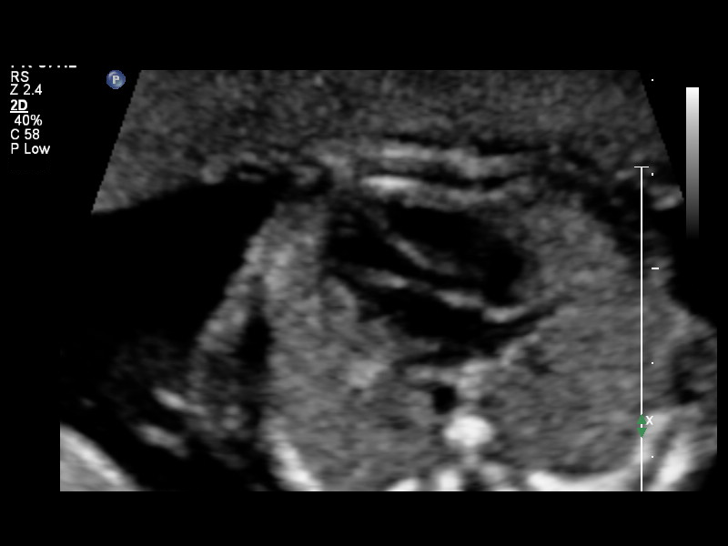
[im 28/74]
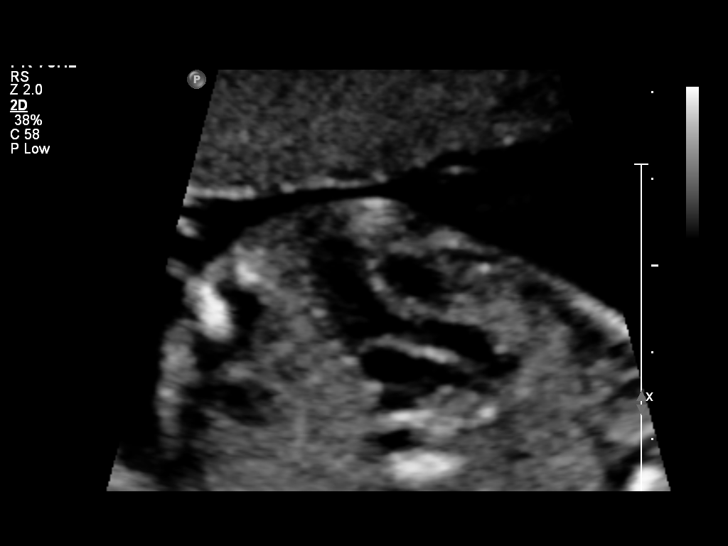
[im 33/74]
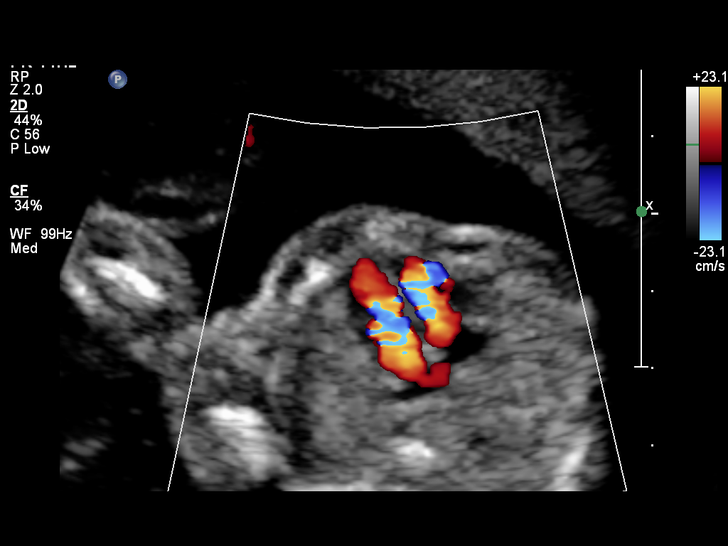
[im 41/74]
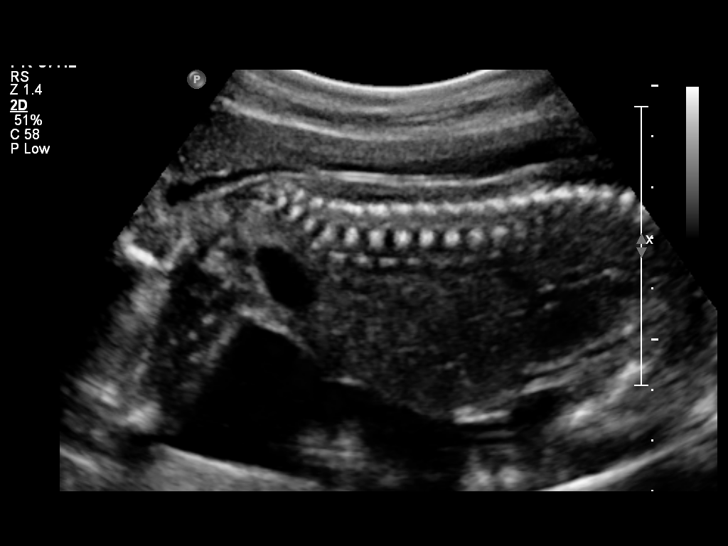
[im 46/74]
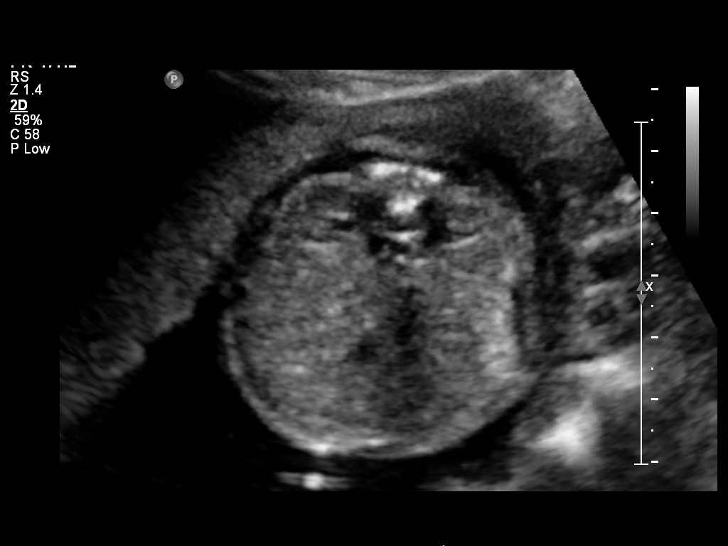
[im 52/74]
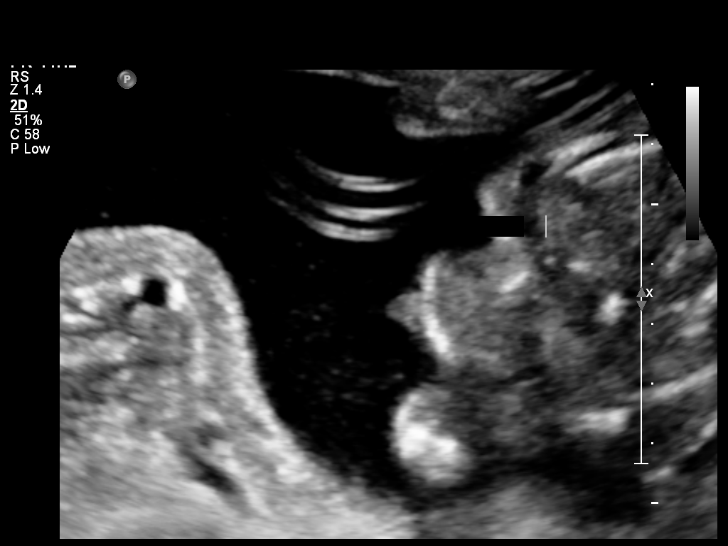
[im 60/74]
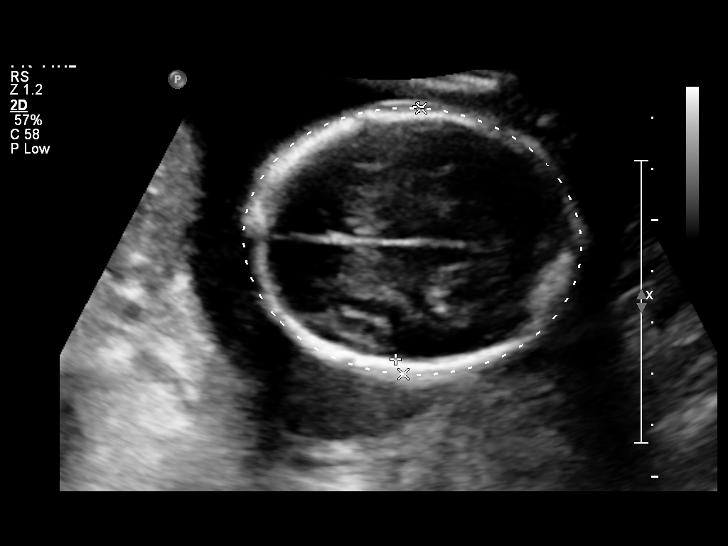
[im 65/74]
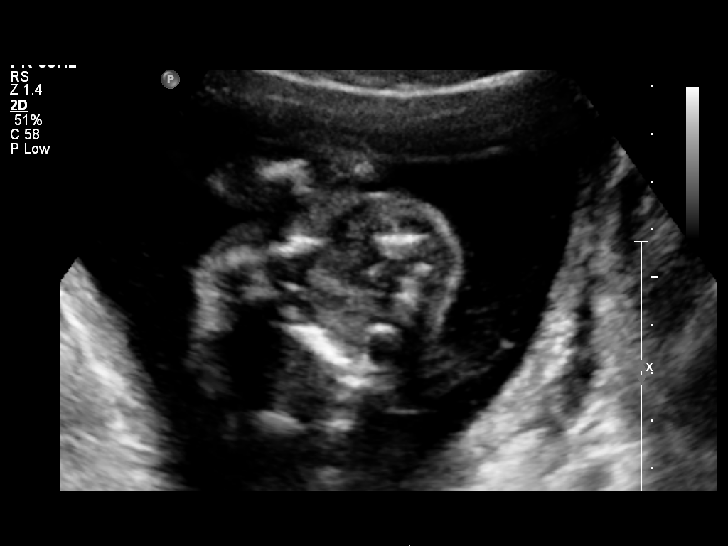
[im 71/74]
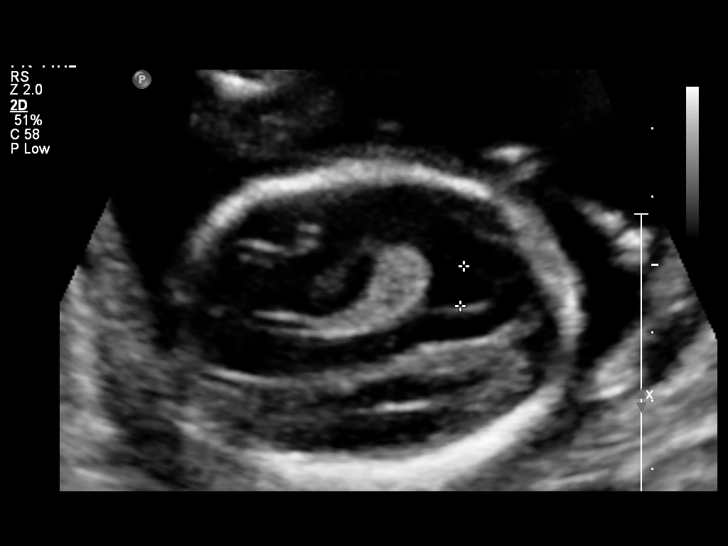

[12 of 28 positions shown; findings below may reference images not displayed]

OBSTETRICS REPORT
                      (Signed Final 10/18/2013 [DATE])

Service(s) Provided

 US OB FOLLOW UP                                       76816.1
Indications

 Evaluate anatomy not seen on prior sonogram
 No or Little Prenatal Care
Fetal Evaluation

 Num Of Fetuses:    1
 Fetal Heart Rate:  139                          bpm
 Cardiac Activity:  Observed
 Presentation:      Cephalic
 Placenta:          Anterior, above cervical os
 P. Cord            Previously Visualized
 Insertion:

 Amniotic Fluid
 AFI FV:      Subjectively within normal limits
                                             Larg Pckt:    4.24  cm
 RUQ:   4.24    cm
Biometry

 BPD:     49.4  mm     G. Age:  20w 6d                CI:        71.19   70 - 86
                                                      FL/HC:      18.9   15.9 -

 HC:     186.5  mm     G. Age:  21w 0d       40  %    HC/AC:      1.17   1.06 -

 AC:     159.9  mm     G. Age:  21w 1d       47  %    FL/BPD:
 FL:      35.2  mm     G. Age:  21w 1d       45  %    FL/AC:      22.0   20 - 24
 HUM:     33.3  mm     G. Age:  21w 2d       53  %
 CER:     20.6  mm     G. Age:  19w 4d       19  %
 NFT:     4.64  mm

 Est. FW:     398  gm    0 lb 14 oz      46  %
Gestational Age

 LMP:           24w 3d        Date:  04/30/13                 EDD:   02/04/14
 U/S Today:     21w 0d                                        EDD:   02/28/14
 Best:          21w 0d     Det. By:  U/S (09/19/13)           EDD:   02/28/14
Anatomy
 Cranium:          Appears normal         Aortic Arch:      Appears normal
 Fetal Cavum:      Previously seen        Ductal Arch:      Appears normal
 Ventricles:       Appears normal         Diaphragm:        Appears normal
 Choroid Plexus:   Previously seen        Stomach:          Appears normal, left
                                                            sided
 Cerebellum:       Previously seen        Abdomen:          Appears normal
 Posterior Fossa:  Previously seen        Abdominal Wall:   Appears nml (cord
                                                            insert, abd wall)
 Nuchal Fold:      Appears normal         Cord Vessels:     Appears normal (3
                                                            vessel cord)
 Face:             Orbits and profile     Kidneys:          Appear normal
                   previously seen
 Lips:             Appears normal         Bladder:          Appears normal
 Heart:            Appears normal         Spine:            Appears normal
                   (4CH, axis, and
                   situs)
 RVOT:             Appears normal         Lower             Previously seen
                                          Extremities:
 LVOT:             Appears normal         Upper             Previously seen
                                          Extremities:

 Other:  Fetus appears to be a male. Heels previously visualized. Nasal bone
         visualized.
Targeted Anatomy

 Fetal Central Nervous System
 Lat. Ventricles:  5.7                    Cisterna Magna:
Cervix Uterus Adnexa

 Cervical Length:    3.04     cm

 Cervix:       Normal appearance by transabdominal scan.
 Left Ovary:    Within normal limits.
 Right Ovary:   Within normal limits.
Impression

 SIUP at 21+0 weeks
 Normal interval anatomy; anatomic survey complete
 Normal amniotic fluid volume
 Appropriate interval growth with EFW at the 46th %tile
Recommendations

 Follow-up as clinically indicated

 questions or concerns.
